# Patient Record
Sex: Female | Born: 1975 | Race: White | Hispanic: No | Smoking: Current every day smoker
Health system: Southern US, Community
[De-identification: ages and names within clinical notes are randomized; demographics above are authoritative.]

## PROBLEM LIST (undated history)

## (undated) DIAGNOSIS — F4310 Post-traumatic stress disorder, unspecified: Secondary | ICD-10-CM

## (undated) DIAGNOSIS — F329 Major depressive disorder, single episode, unspecified: Secondary | ICD-10-CM

## (undated) DIAGNOSIS — R569 Unspecified convulsions: Secondary | ICD-10-CM

## (undated) DIAGNOSIS — E039 Hypothyroidism, unspecified: Secondary | ICD-10-CM

## (undated) DIAGNOSIS — F419 Anxiety disorder, unspecified: Secondary | ICD-10-CM

## (undated) DIAGNOSIS — N390 Urinary tract infection, site not specified: Secondary | ICD-10-CM

## (undated) DIAGNOSIS — O24419 Gestational diabetes mellitus in pregnancy, unspecified control: Secondary | ICD-10-CM

## (undated) DIAGNOSIS — F32A Depression, unspecified: Secondary | ICD-10-CM

## (undated) DIAGNOSIS — N200 Calculus of kidney: Secondary | ICD-10-CM

## (undated) DIAGNOSIS — B1920 Unspecified viral hepatitis C without hepatic coma: Secondary | ICD-10-CM

## (undated) HISTORY — DX: Gestational diabetes mellitus in pregnancy, unspecified control: O24.419

## (undated) HISTORY — DX: Urinary tract infection, site not specified: N39.0

## (undated) HISTORY — PX: CHOLECYSTECTOMY: SHX55

## (undated) HISTORY — DX: Unspecified convulsions: R56.9

## (undated) HISTORY — PX: SHOULDER SURGERY: SHX246

## (undated) HISTORY — DX: Unspecified viral hepatitis C without hepatic coma: B19.20

---

## 2004-06-25 ENCOUNTER — Inpatient Hospital Stay: Payer: Self-pay | Admitting: Psychiatry

## 2004-07-23 ENCOUNTER — Ambulatory Visit: Payer: Self-pay | Admitting: Pain Medicine

## 2004-07-29 ENCOUNTER — Ambulatory Visit: Payer: Self-pay | Admitting: Pain Medicine

## 2004-08-25 ENCOUNTER — Ambulatory Visit: Payer: Self-pay | Admitting: Pain Medicine

## 2004-10-26 ENCOUNTER — Ambulatory Visit: Payer: Self-pay | Admitting: Pain Medicine

## 2005-03-01 ENCOUNTER — Ambulatory Visit: Payer: Self-pay | Admitting: Urology

## 2005-03-31 ENCOUNTER — Ambulatory Visit: Payer: Self-pay | Admitting: Urology

## 2006-04-19 ENCOUNTER — Encounter: Payer: Self-pay | Admitting: Pediatrics

## 2006-06-14 ENCOUNTER — Ambulatory Visit: Payer: Self-pay | Admitting: Urology

## 2006-11-04 ENCOUNTER — Emergency Department: Payer: Self-pay | Admitting: Emergency Medicine

## 2007-07-07 ENCOUNTER — Ambulatory Visit: Payer: Self-pay | Admitting: Pediatrics

## 2009-01-31 ENCOUNTER — Ambulatory Visit: Payer: Self-pay | Admitting: Pediatrics

## 2010-01-15 ENCOUNTER — Ambulatory Visit: Payer: Self-pay | Admitting: Pediatrics

## 2011-11-27 ENCOUNTER — Emergency Department: Payer: Self-pay | Admitting: Emergency Medicine

## 2012-04-27 ENCOUNTER — Emergency Department: Payer: Self-pay | Admitting: Emergency Medicine

## 2012-04-27 LAB — COMPREHENSIVE METABOLIC PANEL
Albumin: 4.2 g/dL (ref 3.4–5.0)
Alkaline Phosphatase: 77 U/L (ref 50–136)
Anion Gap: 3 — ABNORMAL LOW (ref 7–16)
BUN: 5 mg/dL — ABNORMAL LOW (ref 7–18)
Chloride: 111 mmol/L — ABNORMAL HIGH (ref 98–107)
EGFR (Non-African Amer.): >60
Glucose: 105 mg/dL — ABNORMAL HIGH (ref 65–99)
Osmolality: 277 (ref 275–301)
Potassium: 4.2 mmol/L (ref 3.5–5.1)
SGOT(AST): 32 U/L (ref 15–37)
Sodium: 140 mmol/L (ref 136–145)
Total Protein: 7.8 g/dL (ref 6.4–8.2)

## 2012-04-27 LAB — CBC WITH DIFFERENTIAL/PLATELET
Eosinophil #: 0 10*3/uL (ref 0.0–0.7)
Lymphocyte #: 1.5 10*3/uL (ref 1.0–3.6)
MCH: 30.8 pg (ref 26.0–34.0)
MCHC: 34.3 g/dL (ref 32.0–36.0)
MCV: 90 fL (ref 80–100)
Monocyte #: 0.5 x10 3/mm (ref 0.2–0.9)
Neutrophil %: 75.2 %
Platelet: 271 10*3/uL (ref 150–440)
RDW: 12.9 % (ref 11.5–14.5)

## 2012-04-27 LAB — URINALYSIS, COMPLETE
Bilirubin,UR: NEGATIVE
Glucose,UR: NEGATIVE mg/dL (ref 0–75)
Leukocyte Esterase: NEGATIVE
Nitrite: NEGATIVE
Ph: 5 (ref 4.5–8.0)
Protein: 100
RBC,UR: 1 /HPF (ref 0–5)
Specific Gravity: 1.021 (ref 1.003–1.030)
Squamous Epithelial: 1
WBC UR: 2 /HPF (ref 0–5)

## 2012-04-27 LAB — DRUG SCREEN, URINE
Amphetamines, Ur Screen: NEGATIVE (ref ?–1000)
Barbiturates, Ur Screen: NEGATIVE (ref ?–200)
Benzodiazepine, Ur Scrn: POSITIVE (ref ?–200)
Cannabinoid 50 Ng, Ur ~~LOC~~: NEGATIVE (ref ?–50)
MDMA (Ecstasy)Ur Screen: POSITIVE (ref ?–500)
Phencyclidine (PCP) Ur S: NEGATIVE (ref ?–25)

## 2012-05-16 ENCOUNTER — Emergency Department: Payer: Self-pay | Admitting: Emergency Medicine

## 2012-05-16 LAB — DRUG SCREEN, URINE
Amphetamines, Ur Screen: NEGATIVE (ref ?–1000)
Barbiturates, Ur Screen: NEGATIVE (ref ?–200)
Benzodiazepine, Ur Scrn: POSITIVE (ref ?–200)
Cannabinoid 50 Ng, Ur ~~LOC~~: NEGATIVE (ref ?–50)
MDMA (Ecstasy)Ur Screen: NEGATIVE (ref ?–500)
Tricyclic, Ur Screen: NEGATIVE (ref ?–1000)

## 2012-05-16 LAB — BASIC METABOLIC PANEL
Anion Gap: 6 — ABNORMAL LOW (ref 7–16)
BUN: 11 mg/dL (ref 7–18)
Chloride: 104 mmol/L (ref 98–107)
Creatinine: 0.51 mg/dL — ABNORMAL LOW (ref 0.60–1.30)
EGFR (Non-African Amer.): >60
Glucose: 102 mg/dL — ABNORMAL HIGH (ref 65–99)
Osmolality: 270 (ref 275–301)
Sodium: 135 mmol/L — ABNORMAL LOW (ref 136–145)

## 2012-05-16 LAB — CBC
HCT: 39.9 % (ref 35.0–47.0)
HGB: 14.1 g/dL (ref 12.0–16.0)
MCH: 32.2 pg (ref 26.0–34.0)
MCHC: 35.4 g/dL (ref 32.0–36.0)
MCV: 91 fL (ref 80–100)
Platelet: 288 10*3/uL (ref 150–440)
WBC: 8.2 10*3/uL (ref 3.6–11.0)

## 2012-05-16 LAB — ETHANOL: Ethanol: <3 mg/dL

## 2012-10-29 ENCOUNTER — Emergency Department: Payer: Self-pay | Admitting: Emergency Medicine

## 2012-11-25 ENCOUNTER — Emergency Department: Payer: Self-pay | Admitting: Emergency Medicine

## 2013-05-03 ENCOUNTER — Emergency Department: Payer: Self-pay | Admitting: Emergency Medicine

## 2014-01-16 ENCOUNTER — Emergency Department: Payer: Self-pay | Admitting: Emergency Medicine

## 2014-01-16 LAB — COMPREHENSIVE METABOLIC PANEL
ALBUMIN: 3.1 g/dL — AB (ref 3.4–5.0)
ALK PHOS: 84 U/L
ANION GAP: 11 (ref 7–16)
AST: 17 U/L (ref 15–37)
BILIRUBIN TOTAL: 0.1 mg/dL — AB (ref 0.2–1.0)
BUN: 11 mg/dL (ref 7–18)
CHLORIDE: 108 mmol/L — AB (ref 98–107)
Calcium, Total: 8.9 mg/dL (ref 8.5–10.1)
Co2: 22 mmol/L (ref 21–32)
Creatinine: 0.98 mg/dL (ref 0.60–1.30)
EGFR (African American): >60
EGFR (Non-African Amer.): >60
GLUCOSE: 117 mg/dL — AB (ref 65–99)
OSMOLALITY: 282 (ref 275–301)
Potassium: 4.2 mmol/L (ref 3.5–5.1)
SGPT (ALT): 16 U/L
Sodium: 141 mmol/L (ref 136–145)
Total Protein: 7.4 g/dL (ref 6.4–8.2)

## 2014-01-16 LAB — CBC WITH DIFFERENTIAL/PLATELET
BASOS PCT: 0.6 %
Basophil #: 0 10*3/uL (ref 0.0–0.1)
Eosinophil #: 0.1 10*3/uL (ref 0.0–0.7)
Eosinophil %: 0.9 %
HCT: 42.4 % (ref 35.0–47.0)
HGB: 13.7 g/dL (ref 12.0–16.0)
LYMPHS PCT: 27.1 %
Lymphocyte #: 2 10*3/uL (ref 1.0–3.6)
MCH: 27.8 pg (ref 26.0–34.0)
MCHC: 32.2 g/dL (ref 32.0–36.0)
MCV: 86 fL (ref 80–100)
MONO ABS: 0.4 x10 3/mm (ref 0.2–0.9)
Monocyte %: 6 %
Neutrophil #: 4.8 10*3/uL (ref 1.4–6.5)
Neutrophil %: 65.4 %
Platelet: 280 10*3/uL (ref 150–440)
RBC: 4.91 10*6/uL (ref 3.80–5.20)
RDW: 15 % — ABNORMAL HIGH (ref 11.5–14.5)
WBC: 7.4 10*3/uL (ref 3.6–11.0)

## 2014-01-16 LAB — URINALYSIS, COMPLETE
BILIRUBIN, UR: NEGATIVE
Blood: NEGATIVE
GLUCOSE, UR: NEGATIVE mg/dL (ref 0–75)
Ketone: NEGATIVE
NITRITE: NEGATIVE
Ph: 7 (ref 4.5–8.0)
Protein: NEGATIVE
RBC,UR: <1 /HPF (ref 0–5)
SPECIFIC GRAVITY: 1.006 (ref 1.003–1.030)
WBC UR: 52 /HPF (ref 0–5)

## 2014-01-16 LAB — LIPASE, BLOOD: LIPASE: 130 U/L (ref 73–393)

## 2014-01-18 LAB — URINE CULTURE

## 2014-01-21 ENCOUNTER — Emergency Department: Payer: Self-pay | Admitting: Emergency Medicine

## 2014-01-21 LAB — URINALYSIS, COMPLETE
Bilirubin,UR: NEGATIVE
Blood: NEGATIVE
GLUCOSE, UR: NEGATIVE mg/dL (ref 0–75)
Ketone: NEGATIVE
Leukocyte Esterase: NEGATIVE
Nitrite: NEGATIVE
Ph: 6 (ref 4.5–8.0)
Protein: NEGATIVE
RBC,UR: NONE SEEN /HPF (ref 0–5)
Specific Gravity: 1.001 (ref 1.003–1.030)

## 2014-01-21 LAB — COMPREHENSIVE METABOLIC PANEL
ALK PHOS: 102 U/L
ALT: 19 U/L
Albumin: 3.8 g/dL (ref 3.4–5.0)
Anion Gap: 11 (ref 7–16)
BUN: 8 mg/dL (ref 7–18)
Bilirubin,Total: 0.2 mg/dL (ref 0.2–1.0)
CHLORIDE: 109 mmol/L — AB (ref 98–107)
CREATININE: 1 mg/dL (ref 0.60–1.30)
Calcium, Total: 8.8 mg/dL (ref 8.5–10.1)
Co2: 21 mmol/L (ref 21–32)
EGFR (Non-African Amer.): >60
Glucose: 121 mg/dL — ABNORMAL HIGH (ref 65–99)
Osmolality: 281 (ref 275–301)
POTASSIUM: 3.8 mmol/L (ref 3.5–5.1)
SGOT(AST): 26 U/L (ref 15–37)
SODIUM: 141 mmol/L (ref 136–145)
Total Protein: 8.4 g/dL — ABNORMAL HIGH (ref 6.4–8.2)

## 2014-01-21 LAB — CBC WITH DIFFERENTIAL/PLATELET
Basophil #: 0 10*3/uL (ref 0.0–0.1)
Basophil %: 0.4 %
EOS PCT: 0.5 %
Eosinophil #: 0 10*3/uL (ref 0.0–0.7)
HCT: 43.8 % (ref 35.0–47.0)
HGB: 14.4 g/dL (ref 12.0–16.0)
LYMPHS PCT: 19.2 %
Lymphocyte #: 1.6 10*3/uL (ref 1.0–3.6)
MCH: 28.7 pg (ref 26.0–34.0)
MCHC: 32.8 g/dL (ref 32.0–36.0)
MCV: 88 fL (ref 80–100)
Monocyte #: 0.4 x10 3/mm (ref 0.2–0.9)
Monocyte %: 4.8 %
Neutrophil #: 6.3 10*3/uL (ref 1.4–6.5)
Neutrophil %: 75.1 %
Platelet: 342 10*3/uL (ref 150–440)
RBC: 5 10*6/uL (ref 3.80–5.20)
RDW: 15.1 % — AB (ref 11.5–14.5)
WBC: 8.3 10*3/uL (ref 3.6–11.0)

## 2014-01-21 LAB — PREGNANCY, URINE: PREGNANCY TEST, URINE: NEGATIVE m[IU]/mL

## 2014-02-08 ENCOUNTER — Emergency Department: Payer: Self-pay | Admitting: Emergency Medicine

## 2015-02-17 ENCOUNTER — Emergency Department
Admission: EM | Admit: 2015-02-17 | Discharge: 2015-02-17 | Disposition: A | Discharge status: Home / Self Care | Payer: Self-pay | Attending: Student | Admitting: Student

## 2015-02-17 ENCOUNTER — Encounter: Payer: Self-pay | Admitting: Emergency Medicine

## 2015-02-17 DIAGNOSIS — Z79899 Other long term (current) drug therapy: Secondary | ICD-10-CM | POA: Insufficient documentation

## 2015-02-17 DIAGNOSIS — Z3202 Encounter for pregnancy test, result negative: Secondary | ICD-10-CM | POA: Insufficient documentation

## 2015-02-17 DIAGNOSIS — N760 Acute vaginitis: Secondary | ICD-10-CM | POA: Insufficient documentation

## 2015-02-17 DIAGNOSIS — R1030 Lower abdominal pain, unspecified: Secondary | ICD-10-CM | POA: Insufficient documentation

## 2015-02-17 DIAGNOSIS — R110 Nausea: Secondary | ICD-10-CM | POA: Insufficient documentation

## 2015-02-17 DIAGNOSIS — Z720 Tobacco use: Secondary | ICD-10-CM | POA: Insufficient documentation

## 2015-02-17 DIAGNOSIS — B9689 Other specified bacterial agents as the cause of diseases classified elsewhere: Secondary | ICD-10-CM

## 2015-02-17 LAB — COMPREHENSIVE METABOLIC PANEL
ALBUMIN: 3.7 g/dL (ref 3.5–5.0)
ALT: 13 U/L — ABNORMAL LOW (ref 14–54)
ANION GAP: 8 (ref 5–15)
AST: 20 U/L (ref 15–41)
Alkaline Phosphatase: 79 U/L (ref 38–126)
BUN: 8 mg/dL (ref 6–20)
CHLORIDE: 108 mmol/L (ref 101–111)
CO2: 24 mmol/L (ref 22–32)
Calcium: 9 mg/dL (ref 8.9–10.3)
Creatinine, Ser: 0.92 mg/dL (ref 0.44–1.00)
GFR calc non Af Amer: >60 mL/min (ref >60)
GLUCOSE: 91 mg/dL (ref 65–99)
POTASSIUM: 3.7 mmol/L (ref 3.5–5.1)
SODIUM: 140 mmol/L (ref 135–145)
Total Bilirubin: 0.4 mg/dL (ref 0.3–1.2)
Total Protein: 6.8 g/dL (ref 6.5–8.1)

## 2015-02-17 LAB — URINALYSIS COMPLETE WITH MICROSCOPIC (ARMC ONLY)
BILIRUBIN URINE: NEGATIVE
Glucose, UA: NEGATIVE mg/dL
HGB URINE DIPSTICK: NEGATIVE
KETONES UR: NEGATIVE mg/dL
LEUKOCYTES UA: NEGATIVE
NITRITE: NEGATIVE
PH: 6 (ref 5.0–8.0)
PROTEIN: NEGATIVE mg/dL
Specific Gravity, Urine: 1.003 — ABNORMAL LOW (ref 1.005–1.030)

## 2015-02-17 LAB — CBC WITH DIFFERENTIAL/PLATELET
BASOS PCT: 0 %
Basophils Absolute: 0 10*3/uL (ref 0–0.1)
EOS ABS: 0.1 10*3/uL (ref 0–0.7)
EOS PCT: 1 %
HCT: 38.2 % (ref 35.0–47.0)
HEMOGLOBIN: 12.5 g/dL (ref 12.0–16.0)
LYMPHS ABS: 2.1 10*3/uL (ref 1.0–3.6)
Lymphocytes Relative: 32 %
MCH: 28.2 pg (ref 26.0–34.0)
MCHC: 32.7 g/dL (ref 32.0–36.0)
MCV: 86.2 fL (ref 80.0–100.0)
MONOS PCT: 4 %
Monocytes Absolute: 0.3 10*3/uL (ref 0.2–0.9)
NEUTROS PCT: 63 %
Neutro Abs: 4.1 10*3/uL (ref 1.4–6.5)
PLATELETS: 288 10*3/uL (ref 150–440)
RBC: 4.44 MIL/uL (ref 3.80–5.20)
RDW: 14.7 % — ABNORMAL HIGH (ref 11.5–14.5)
WBC: 6.6 10*3/uL (ref 3.6–11.0)

## 2015-02-17 LAB — CHLAMYDIA/NGC RT PCR (ARMC ONLY)
CHLAMYDIA TR: NOT DETECTED
N GONORRHOEAE: NOT DETECTED

## 2015-02-17 LAB — WET PREP, GENITAL
TRICH WET PREP: NONE SEEN
YEAST WET PREP: NONE SEEN

## 2015-02-17 LAB — POCT PREGNANCY, URINE: Preg Test, Ur: NEGATIVE

## 2015-02-17 LAB — LIPASE, BLOOD: Lipase: 17 U/L — ABNORMAL LOW (ref 22–51)

## 2015-02-17 MED ORDER — KETOROLAC TROMETHAMINE 30 MG/ML IJ SOLN
30.0000 mg | Freq: Once | INTRAMUSCULAR | Status: AC
  Administered 2015-02-17: 30 mg via INTRAMUSCULAR
  Filled 2015-02-17: qty 1

## 2015-02-17 MED ORDER — KETOROLAC TROMETHAMINE 30 MG/ML IJ SOLN: 30.0000 mg | Freq: Once | INTRAMUSCULAR | Status: DC

## 2015-02-17 MED ORDER — KETOROLAC TROMETHAMINE 60 MG/2ML IM SOLN: 30.0000 mg | Freq: Once | INTRAMUSCULAR | Status: DC

## 2015-02-17 MED ORDER — ONDANSETRON HCL 4 MG/2ML IJ SOLN: 4.0000 mg | Freq: Once | INTRAMUSCULAR | Status: DC

## 2015-02-17 MED ORDER — KETOROLAC TROMETHAMINE 60 MG/2ML IM SOLN: 60.0000 mg | Freq: Once | INTRAMUSCULAR | Status: DC

## 2015-02-17 MED ORDER — ONDANSETRON 4 MG PO TBDP
4.0000 mg | ORAL_TABLET | Freq: Once | ORAL | Status: AC
  Administered 2015-02-17: 4 mg via ORAL
  Filled 2015-02-17: qty 1

## 2015-02-17 MED ORDER — IBUPROFEN 600 MG PO TABS: 600.0000 mg | ORAL_TABLET | Freq: Four times a day (QID) | ORAL | Status: DC | PRN

## 2015-02-17 MED ORDER — ONDANSETRON 4 MG PO TBDP: 4.0000 mg | ORAL_TABLET | Freq: Once | ORAL | Status: DC

## 2015-02-17 MED ORDER — METRONIDAZOLE 500 MG PO TABS: 500.0000 mg | ORAL_TABLET | Freq: Two times a day (BID) | ORAL | Status: DC

## 2015-02-17 NOTE — ED Notes (Signed)
Patient reports having mid back pain that radiates straight across and vaginal discomfort since yesterday with nausea that has become progressively worse.  Patient reports similar episode in the past and diagnosed with kidney stones.  Patient reports took AZO at home without relief.

## 2015-02-17 NOTE — ED Provider Notes (Signed)
[facility] Emergency Department Provider Note  ____________________________________________  Time seen: Approximately 4:44 AM  I have reviewed the triage vital signs and the nursing notes.   HISTORY  Chief Complaint Back Pain    HPI Destiny Myers is a 39 y.o. female with history of thyroid disorder and history of kidney stones who presents for evaluation of diffuse lower abdominal pain radiating to the back, constant since yesterday, worsening. She describes it as waxing and waning but always present. It is a dull aching pain. She also reports a sensation of "scraping in my vagina" as well as nausea. She denies any abnormal vaginal bleeding or vaginal discharge. She denies any concern for sexual transmitted infection. She reports that in the past when she has had the symptoms she was diagnosed with kidney stones. She denies any dysuria or hematuria. No vomiting, diarrhea, fevers, chills, chest pain or difficulty breathing. No modifying factors.   History reviewed. No pertinent past medical history.  There are no active problems to display for this patient.   Past Surgical History  Procedure Laterality Date  . Cesarean section    . Shoulder surgery Right     Current Outpatient Rx  Name  Route  Sig  Dispense  Refill  . clonazePAM (KLONOPIN) 1 MG tablet   Oral   Take 1 mg by mouth 4 (four) times daily.         . cloNIDine (CATAPRES) 0.1 MG tablet   Oral   Take 0.1 mg by mouth 2 (two) times daily.         . cloNIDine (CATAPRES) 0.1 MG tablet   Oral   Take 0.1 mg by mouth at bedtime.         Marland Kitchen levothyroxine (SYNTHROID, LEVOTHROID) 88 MCG tablet   Oral   Take 88 mcg by mouth daily before breakfast.         . ibuprofen (ADVIL,MOTRIN) 600 MG tablet   Oral   Take 1 tablet (600 mg total) by mouth every 6 (six) hours as needed for moderate pain.   15 tablet   0   . metroNIDAZOLE (FLAGYL) 500 MG tablet   Oral   Take 1 tablet (500 mg  total) by mouth 2 (two) times daily. Do not drink alcohol while taking this medication.   14 tablet   0     Allergies Sulfa antibiotics and Tylenol  No family history on file.  Social History Social History  Substance Use Topics  . Smoking status: Current Every Day Smoker -- 0.50 packs/day    Types: Cigarettes  . Smokeless tobacco: None  . Alcohol Use: No    Review of Systems Constitutional: No fever/chills Eyes: No visual changes. ENT: No sore throat. Cardiovascular: Denies chest pain. Respiratory: Denies shortness of breath. Gastrointestinal: No abdominal pain.  + nausea, no vomiting.  No diarrhea.  No constipation. Genitourinary: Negative for dysuria. Musculoskeletal: positive for back pain. Skin: Negative for rash. Neurological: Negative for headaches, focal weakness or numbness.  10-point ROS otherwise negative.  ____________________________________________   PHYSICAL EXAM:  VITAL SIGNS: ED Triage Vitals  Enc Vitals Group     BP 02/17/15 0342 182/100 mmHg     Pulse Rate 02/17/15 0342 107     Resp 02/17/15 0342 18     Temp 02/17/15 0342 97.7 F (36.5 C)     Temp Source 02/17/15 0342 Oral     SpO2 02/17/15 0342 99 %     Weight 02/17/15 0342 195 lb (  88.451 kg)     Height 02/17/15 0342 5\' 4"  (1.626 m)     Head Cir --      Peak Flow --      Pain Score 02/17/15 0343 7     Pain Loc --      Pain Edu? --      Excl. in GC? --     Constitutional: Alert and oriented. Well appearing and in no acute distress. Eyes: Conjunctivae are normal. PERRL. EOMI. Head: Atraumatic. Nose: No congestion/rhinnorhea. Mouth/Throat: Mucous membranes are moist.  Oropharynx non-erythematous. Neck: No stridor.   Cardiovascular: normal rate, regular rhythm. Grossly normal heart sounds.  Good peripheral circulation. Respiratory: Normal respiratory effort.  No retractions. Lungs CTAB. Gastrointestinal: Soft and nontender. Normal bowel sounds. No distention.  No CVA  tenderness. Pelvic: No evidence of trauma to the vaginal walls, closed os with thin non-malodorous clear vaginal discharge. No bimanual tenderness or cervical motion tenderness. Musculoskeletal: No lower extremity tenderness nor edema.  No joint effusions. Neurologic:  Normal speech and language. No gross focal neurologic deficits are appreciated. No gait instability. Skin:  Skin is warm, dry and intact. No rash noted. Psychiatric: Mood and affect are normal. Speech and behavior are normal.  ____________________________________________   LABS (all labs ordered are listed, but only abnormal results are displayed)  Labs Reviewed  WET PREP, GENITAL - Abnormal; Notable for the following:    Clue Cells Wet Prep HPF POC MODERATE (*)    WBC, Wet Prep HPF POC FEW (*)    All other components within normal limits  URINALYSIS COMPLETEWITH MICROSCOPIC ([facility] ONLY) - Abnormal; Notable for the following:    Color, Urine YELLOW (*)    APPearance CLEAR (*)    Specific Gravity, Urine 1.003 (*)    Bacteria, UA RARE (*)    Squamous Epithelial / LPF 0-5 (*)    All other components within normal limits  CBC WITH DIFFERENTIAL/PLATELET - Abnormal; Notable for the following:    RDW 14.7 (*)    All other components within normal limits  COMPREHENSIVE METABOLIC PANEL - Abnormal; Notable for the following:    ALT 13 (*)    All other components within normal limits  LIPASE, BLOOD - Abnormal; Notable for the following:    Lipase 17 (*)    All other components within normal limits  CHLAMYDIA/NGC RT PCR ([facility] ONLY)  POCT PREGNANCY, URINE   ____________________________________________  EKG  None ____________________________________________  RADIOLOGY  none ____________________________________________   PROCEDURES  Procedure(s) performed: None  Critical Care performed: No  ____________________________________________   INITIAL IMPRESSION / ASSESSMENT AND PLAN / ED  COURSE  Pertinent labs & imaging results that were available during my care of the patient were reviewed by me and considered in my medical decision making (see chart for details).  [name] is a 39 y.o. female with history of thyroid disorder and history of kidney stones who presents for evaluation of diffuse lower abdominal pain radiating to the back, constant since yesterday, worsening. On exam, she is very well-appearing and in no acute distress. Mildly tachycardic and also hypertensive on arrival however this resolved without any intervention  by the time I performed my initial assessment. Her abdominal exam is benign and I doubt any acute life-threatening interabdominal process. She has no tenderness, rebound, guarding. No bimanual or cervical motion tenderness on exam, doubt PID (GC chlamydia pending). Labs reviewed. Normal CBC, normal CMP. Negative pregnancy test. Urinalysis not consistent with urinary tract infection, no red blood  cells and I doubt any acute obstructing nephrolithiasis. She does have moderate clue cells on her wet prep so will treat with Flagyl for bacterial vaginosis. I do not think she requires any abdominal imaging at this time. Her pain is improved at this time. She is tolerating by mouth intake. I discussed with her that we would discharge her with close PCP follow-up and we discussed meticulous return precautions including immediate return precautions.. She is comfortable with the discharge plan. ____________________________________________   FINAL CLINICAL IMPRESSION(S) / ED DIAGNOSES  Final diagnoses:  Lower abdominal pain  Bacterial vaginosis      Gayla Doss, MD 02/17/15 [phone]

## 2015-02-17 NOTE — ED Notes (Signed)
Patient ambulatory to triage with steady gait, without difficulty or distress noted; pt reports vaginal pain and lower back pain since yesterday

## 2015-02-17 NOTE — ED Notes (Signed)
Resting quietly with eyes closed.

## 2015-02-18 ENCOUNTER — Telehealth: Payer: Self-pay | Admitting: Emergency Medicine

## 2015-02-18 NOTE — ED Notes (Signed)
Pt called here asking if pregnancy test was done.  i explained that result was negative, but she truly is only a few days late for period and they have been getting closer together.  i told her she could repeat the test in a few days and that she should follow up with her gyn about this and the change in her menses.  She agrees.

## 2015-06-16 ENCOUNTER — Encounter: Payer: Self-pay | Admitting: *Deleted

## 2015-06-16 ENCOUNTER — Inpatient Hospital Stay
Admission: EM | Admit: 2015-06-16 | Discharge: 2015-06-19 | DRG: 885 | Disposition: A | Discharge status: Home / Self Care | Payer: No Typology Code available for payment source | Source: Intra-hospital | Attending: Psychiatry | Admitting: Psychiatry

## 2015-06-16 ENCOUNTER — Emergency Department
Admission: EM | Admit: 2015-06-16 | Discharge: 2015-06-16 | Disposition: A | Discharge status: Other Acute Inpatient Hospital | Payer: No Typology Code available for payment source | Attending: Emergency Medicine | Admitting: Emergency Medicine

## 2015-06-16 ENCOUNTER — Encounter: Payer: Self-pay | Admitting: Emergency Medicine

## 2015-06-16 DIAGNOSIS — F1110 Opioid abuse, uncomplicated: Secondary | ICD-10-CM | POA: Diagnosis present

## 2015-06-16 DIAGNOSIS — F17210 Nicotine dependence, cigarettes, uncomplicated: Secondary | ICD-10-CM | POA: Diagnosis present

## 2015-06-16 DIAGNOSIS — Z915 Personal history of self-harm: Secondary | ICD-10-CM | POA: Diagnosis not present

## 2015-06-16 DIAGNOSIS — N946 Dysmenorrhea, unspecified: Secondary | ICD-10-CM | POA: Diagnosis present

## 2015-06-16 DIAGNOSIS — Z882 Allergy status to sulfonamides status: Secondary | ICD-10-CM

## 2015-06-16 DIAGNOSIS — G4700 Insomnia, unspecified: Secondary | ICD-10-CM | POA: Diagnosis present

## 2015-06-16 DIAGNOSIS — F332 Major depressive disorder, recurrent severe without psychotic features: Secondary | ICD-10-CM

## 2015-06-16 DIAGNOSIS — Y998 Other external cause status: Secondary | ICD-10-CM | POA: Insufficient documentation

## 2015-06-16 DIAGNOSIS — F1010 Alcohol abuse, uncomplicated: Secondary | ICD-10-CM | POA: Diagnosis present

## 2015-06-16 DIAGNOSIS — E039 Hypothyroidism, unspecified: Secondary | ICD-10-CM | POA: Diagnosis present

## 2015-06-16 DIAGNOSIS — T402X2A Poisoning by other opioids, intentional self-harm, initial encounter: Secondary | ICD-10-CM | POA: Insufficient documentation

## 2015-06-16 DIAGNOSIS — F4310 Post-traumatic stress disorder, unspecified: Secondary | ICD-10-CM | POA: Diagnosis present

## 2015-06-16 DIAGNOSIS — F419 Anxiety disorder, unspecified: Secondary | ICD-10-CM | POA: Diagnosis present

## 2015-06-16 DIAGNOSIS — F17200 Nicotine dependence, unspecified, uncomplicated: Secondary | ICD-10-CM | POA: Diagnosis present

## 2015-06-16 DIAGNOSIS — Z62810 Personal history of physical and sexual abuse in childhood: Secondary | ICD-10-CM | POA: Diagnosis present

## 2015-06-16 DIAGNOSIS — Y9289 Other specified places as the place of occurrence of the external cause: Secondary | ICD-10-CM | POA: Insufficient documentation

## 2015-06-16 DIAGNOSIS — Z9889 Other specified postprocedural states: Secondary | ICD-10-CM | POA: Diagnosis not present

## 2015-06-16 DIAGNOSIS — T50902A Poisoning by unspecified drugs, medicaments and biological substances, intentional self-harm, initial encounter: Secondary | ICD-10-CM

## 2015-06-16 DIAGNOSIS — T426X2A Poisoning by other antiepileptic and sedative-hypnotic drugs, intentional self-harm, initial encounter: Secondary | ICD-10-CM | POA: Insufficient documentation

## 2015-06-16 DIAGNOSIS — F515 Nightmare disorder: Secondary | ICD-10-CM | POA: Diagnosis present

## 2015-06-16 DIAGNOSIS — E8881 Metabolic syndrome: Secondary | ICD-10-CM | POA: Diagnosis present

## 2015-06-16 DIAGNOSIS — Z818 Family history of other mental and behavioral disorders: Secondary | ICD-10-CM

## 2015-06-16 DIAGNOSIS — F1310 Sedative, hypnotic or anxiolytic abuse, uncomplicated: Secondary | ICD-10-CM | POA: Insufficient documentation

## 2015-06-16 DIAGNOSIS — R45851 Suicidal ideations: Secondary | ICD-10-CM | POA: Diagnosis present

## 2015-06-16 DIAGNOSIS — Z813 Family history of other psychoactive substance abuse and dependence: Secondary | ICD-10-CM

## 2015-06-16 DIAGNOSIS — Z79899 Other long term (current) drug therapy: Secondary | ICD-10-CM | POA: Insufficient documentation

## 2015-06-16 DIAGNOSIS — T1491XA Suicide attempt, initial encounter: Secondary | ICD-10-CM

## 2015-06-16 DIAGNOSIS — Z3202 Encounter for pregnancy test, result negative: Secondary | ICD-10-CM | POA: Insufficient documentation

## 2015-06-16 DIAGNOSIS — Y9389 Activity, other specified: Secondary | ICD-10-CM | POA: Insufficient documentation

## 2015-06-16 HISTORY — DX: Depression, unspecified: F32.A

## 2015-06-16 HISTORY — DX: Hypothyroidism, unspecified: E03.9

## 2015-06-16 HISTORY — DX: Anxiety disorder, unspecified: F41.9

## 2015-06-16 HISTORY — DX: Post-traumatic stress disorder, unspecified: F43.10

## 2015-06-16 HISTORY — DX: Major depressive disorder, single episode, unspecified: F32.9

## 2015-06-16 LAB — COMPREHENSIVE METABOLIC PANEL
ALT: 23 U/L (ref 14–54)
AST: 44 U/L — AB (ref 15–41)
Albumin: 4.3 g/dL (ref 3.5–5.0)
Alkaline Phosphatase: 93 U/L (ref 38–126)
Anion gap: 10 (ref 5–15)
BUN: 10 mg/dL (ref 6–20)
CALCIUM: 8.9 mg/dL (ref 8.9–10.3)
CO2: 23 mmol/L (ref 22–32)
Chloride: 105 mmol/L (ref 101–111)
Creatinine, Ser: 1.1 mg/dL — ABNORMAL HIGH (ref 0.44–1.00)
GFR calc Af Amer: >60 mL/min (ref >60)
Glucose, Bld: 239 mg/dL — ABNORMAL HIGH (ref 65–99)
Potassium: 3.8 mmol/L (ref 3.5–5.1)
Sodium: 138 mmol/L (ref 135–145)
Total Protein: 7.6 g/dL (ref 6.5–8.1)

## 2015-06-16 LAB — URINALYSIS COMPLETE WITH MICROSCOPIC (ARMC ONLY)
BACTERIA UA: NONE SEEN
Bilirubin Urine: NEGATIVE
GLUCOSE, UA: 150 mg/dL — AB
HGB URINE DIPSTICK: NEGATIVE
Ketones, ur: NEGATIVE mg/dL
LEUKOCYTES UA: NEGATIVE
Nitrite: NEGATIVE
PH: 5 (ref 5.0–8.0)
Protein, ur: NEGATIVE mg/dL
SPECIFIC GRAVITY, URINE: 1.012 (ref 1.005–1.030)

## 2015-06-16 LAB — LIPID PANEL
CHOL/HDL RATIO: 3.1 ratio
CHOLESTEROL: 144 mg/dL (ref 0–200)
HDL: 46 mg/dL (ref >40)
LDL Cholesterol: 88 mg/dL (ref 0–99)
TRIGLYCERIDES: 49 mg/dL (ref <150)
VLDL: 10 mg/dL (ref 0–40)

## 2015-06-16 LAB — CBC
HEMATOCRIT: 45.5 % (ref 35.0–47.0)
Hemoglobin: 14.5 g/dL (ref 12.0–16.0)
MCH: 28.4 pg (ref 26.0–34.0)
MCHC: 32 g/dL (ref 32.0–36.0)
MCV: 89 fL (ref 80.0–100.0)
Platelets: 281 10*3/uL (ref 150–440)
RBC: 5.12 MIL/uL (ref 3.80–5.20)
RDW: 15 % — AB (ref 11.5–14.5)
WBC: 20.4 10*3/uL — AB (ref 3.6–11.0)

## 2015-06-16 LAB — ACETAMINOPHEN LEVEL

## 2015-06-16 LAB — SALICYLATE LEVEL

## 2015-06-16 LAB — URINE DRUG SCREEN, QUALITATIVE (ARMC ONLY)
AMPHETAMINES, UR SCREEN: NOT DETECTED
BENZODIAZEPINE, UR SCRN: POSITIVE — AB
Barbiturates, Ur Screen: NOT DETECTED
COCAINE METABOLITE, UR ~~LOC~~: NOT DETECTED
Cannabinoid 50 Ng, Ur ~~LOC~~: NOT DETECTED
MDMA (ECSTASY) UR SCREEN: NOT DETECTED
METHADONE SCREEN, URINE: NOT DETECTED
OPIATE, UR SCREEN: POSITIVE — AB
PHENCYCLIDINE (PCP) UR S: NOT DETECTED
Tricyclic, Ur Screen: NOT DETECTED

## 2015-06-16 LAB — ETHANOL: Alcohol, Ethyl (B): 5 mg/dL — ABNORMAL HIGH (ref <5)

## 2015-06-16 LAB — TSH: TSH: 1.231 u[IU]/mL (ref 0.350–4.500)

## 2015-06-16 LAB — PREGNANCY, URINE: PREG TEST UR: NEGATIVE

## 2015-06-16 MED ORDER — LEVOTHYROXINE SODIUM 75 MCG PO TABS
75.0000 ug | ORAL_TABLET | Freq: Every day | ORAL | Status: DC
  Filled 2015-06-16: qty 1

## 2015-06-16 MED ORDER — SERTRALINE HCL 100 MG PO TABS
100.0000 mg | ORAL_TABLET | Freq: Every day | ORAL | Status: DC
  Administered 2015-06-17 – 2015-06-18 (×2): 100 mg via ORAL
  Filled 2015-06-16 (×2): qty 1

## 2015-06-16 MED ORDER — ACETAMINOPHEN 325 MG PO TABS: 650.0000 mg | ORAL_TABLET | Freq: Four times a day (QID) | ORAL | Status: DC | PRN

## 2015-06-16 MED ORDER — CLONAZEPAM 0.5 MG PO TABS
1.0000 mg | ORAL_TABLET | Freq: Three times a day (TID) | ORAL | Status: DC
  Administered 2015-06-16: 1 mg via ORAL
  Filled 2015-06-16: qty 2

## 2015-06-16 MED ORDER — CLONIDINE HCL 0.1 MG PO TABS
0.1000 mg | ORAL_TABLET | Freq: Two times a day (BID) | ORAL | Status: DC
  Administered 2015-06-16 – 2015-06-18 (×5): 0.1 mg via ORAL
  Filled 2015-06-16 (×5): qty 1

## 2015-06-16 MED ORDER — LEVOTHYROXINE SODIUM 88 MCG PO TABS
88.0000 ug | ORAL_TABLET | Freq: Every day | ORAL | Status: DC
  Administered 2015-06-17 – 2015-06-18 (×2): 88 ug via ORAL
  Filled 2015-06-16 (×4): qty 1

## 2015-06-16 MED ORDER — MAGNESIUM HYDROXIDE 400 MG/5ML PO SUSP: 30.0000 mL | Freq: Every day | ORAL | Status: DC | PRN

## 2015-06-16 MED ORDER — NALOXONE HCL 2 MG/2ML IJ SOSY
1.0000 mg | PREFILLED_SYRINGE | Freq: Once | INTRAMUSCULAR | Status: AC
  Administered 2015-06-16: 1 mg via INTRAVENOUS

## 2015-06-16 MED ORDER — SERTRALINE HCL 100 MG PO TABS
100.0000 mg | ORAL_TABLET | Freq: Every day | ORAL | Status: DC
  Administered 2015-06-16: 100 mg via ORAL
  Filled 2015-06-16: qty 1

## 2015-06-16 MED ORDER — ALUM & MAG HYDROXIDE-SIMETH 200-200-20 MG/5ML PO SUSP
30.0000 mL | ORAL | Status: DC | PRN
Start: 2015-06-16 — End: 2015-06-19

## 2015-06-16 MED ORDER — CLONIDINE HCL 0.1 MG PO TABS
0.1000 mg | ORAL_TABLET | Freq: Two times a day (BID) | ORAL | Status: DC
  Administered 2015-06-16: 0.1 mg via ORAL
  Filled 2015-06-16: qty 1

## 2015-06-16 MED ORDER — CLONAZEPAM 1 MG PO TABS
1.0000 mg | ORAL_TABLET | Freq: Three times a day (TID) | ORAL | Status: DC
  Administered 2015-06-16: 1 mg via ORAL
  Filled 2015-06-16: qty 1

## 2015-06-16 MED ORDER — LEVOTHYROXINE SODIUM 88 MCG PO TABS: 88.0000 ug | ORAL_TABLET | Freq: Every day | ORAL | Status: DC

## 2015-06-16 MED ORDER — NALOXONE HCL 2 MG/2ML IJ SOSY
PREFILLED_SYRINGE | INTRAMUSCULAR | Status: AC
  Administered 2015-06-16: 1 mg via INTRAVENOUS
  Filled 2015-06-16: qty 2

## 2015-06-16 MED ORDER — SODIUM CHLORIDE 0.9 % IV SOLN
1000.0000 mL | Freq: Once | INTRAVENOUS | Status: AC
  Administered 2015-06-16: 1000 mL via INTRAVENOUS

## 2015-06-16 NOTE — ED Notes (Signed)
MD at bedside. 

## 2015-06-16 NOTE — ED Notes (Signed)
Skin warm and dry and pink. Cont to maintain SATs with 02 on. Cont very drowsy. VS as noted.
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
## 2015-06-16 NOTE — ED Notes (Signed)
Pt very drowsy, does respond with one word with stimulation but returns to resting with eyes closed, Narcan given, patient opens eyes and asks for something to drink, able to swallow po, cont drowsy but awake.
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
## 2015-06-16 NOTE — ED Notes (Signed)
Returns to sleep, maintains SATs with nasal 02.
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
## 2015-06-16 NOTE — ED Notes (Signed)
When questioned re husband states he will upset when he comes in and that she does not want to see him.

## 2015-06-16 NOTE — ED Provider Notes (Signed)
[facility] Emergency Department Provider Note  ____________________________________________  Time seen: On arrival  I have reviewed the triage vital signs and the nursing notes.   HISTORY  Chief Complaint Altered Mental Status    HPI Destiny Myers is a 40 y.o. female who presents with reported altered mental status. Per EMS they were called out for unresponsive patient. They arrived at the house to find the patient lying in bed with what they described as agonal breathing. They did give 2 mg of IV Narcan and this appeared to improve the patient's breathing. Upon arrival to the ED patient is alert and oriented and answered questions appropriately.  Patient admits to drinking beers, approximately 24, as well as taking 9 to 10, 15 milligram morphine pills and approximately 5 Lyrica pills from her friend without him knowing. When asked why she did this she says "I don't know". Review of records demonstrates a history of depression for which she sees Dr. Mariana Kaufman at [facility]. She does complain of normal hearing, that she describes an echo. She also describes it both of her legs or tingling. She denies muscle weakness however, she denies headache, she denies trauma.       No past medical history on file.  There are no active problems to display for this patient.   Past Surgical History  Procedure Laterality Date  . Cesarean section    . Shoulder surgery Right     Current Outpatient Rx  Name  Route  Sig  Dispense  Refill  . clonazePAM (KLONOPIN) 1 MG tablet   Oral   Take 1 mg by mouth 4 (four) times daily.         . cloNIDine (CATAPRES) 0.1 MG tablet   Oral   Take 0.1 mg by mouth 2 (two) times daily.         . cloNIDine (CATAPRES) 0.1 MG tablet   Oral   Take 0.1 mg by mouth at bedtime.         Marland Kitchen ibuprofen (ADVIL,MOTRIN) 600 MG tablet   Oral   Take 1 tablet (600 mg total) by mouth every 6 (six) hours as needed for moderate pain.   15 tablet   0   . levothyroxine (SYNTHROID, LEVOTHROID) 88 MCG tablet   Oral   Take 88 mcg by mouth daily before breakfast.         . metroNIDAZOLE (FLAGYL) 500 MG tablet   Oral   Take 1 tablet (500 mg total) by mouth 2 (two) times daily. Do not drink alcohol while taking this medication.   14 tablet   0     Allergies Sulfa antibiotics and Tylenol  No family history on file.  Social History Social History  Substance Use Topics  . Smoking status: Current Every Day Smoker -- 0.50 packs/day    Types: Cigarettes  . Smokeless tobacco: Not on file  . Alcohol Use: No    Review of Systems  Constitutional: Negative for recent illness Eyes: Negative for visual changes. ENT: Negative for sore throat, abnormal hearing as above Cardiovascular: Negative for chest pain. Respiratory: Negative for shortness of breath. Gastrointestinal: Negative for abdominal pain Genitourinary: Negative for dysuria. Musculoskeletal: Negative for back pain. Skin: Negative for rash. Neurological: Negative for headaches, no weakness Psychiatric: Patient appears depressed    ____________________________________________   PHYSICAL EXAM:  VITAL SIGNS: ED Triage Vitals  Enc Vitals Group     BP 06/16/15 0655 140/90 mmHg     Pulse Rate 06/16/15 0655  107     Resp 06/16/15 0655 18     Temp 06/16/15 0655 97.3 F (36.3 C)     Temp Source 06/16/15 0655 Oral     SpO2 06/16/15 0655 97 %     Weight 06/16/15 0655 189 lb (85.73 kg)     Height 06/16/15 0655 5\' 4"  (1.626 m)     Head Cir --      Peak Flow --      Pain Score --      Pain Loc --      Pain Edu? --      Excl. in GC? --      Constitutional: Alert and oriented. No acute distress Eyes: Conjunctivae are normal. PERRLA, EOMI ENT   Head: Normocephalic and atraumatic.   Mouth/Throat: Mucous membranes are moist. Cardiovascular: Normal rate, regular rhythm. Normal and symmetric distal pulses are present in all extremities. No murmurs, rubs, or  gallops. Respiratory: Normal respiratory effort without tachypnea nor retractions. Breath sounds are clear and equal bilaterally.  Gastrointestinal: Soft and non-tender in all quadrants. No distention. There is no CVA tenderness. Genitourinary: deferred Musculoskeletal: Nontender with normal range of motion in all extremities. No lower extremity tenderness nor edema. Neurologic:  Normal speech and language. No gross focal neurologic deficits are appreciated. Cranial nerves II through XII are normal, normal strength in all extremities, incision grossly intact Skin:  Skin is warm, dry and intact. No rash noted. Psychiatric: Mood and affect are normal. Patient exhibits appropriate insight and judgment.  ____________________________________________    LABS (pertinent positives/negatives)  Labs Reviewed - No data to display  ____________________________________________   EKG  ED ECG REPORT I, Jene EveryKINNER, Annabelle Rexroad, the attending physician, personally viewed and interpreted this ECG.  Date: 06/16/2015 EKG Time: 6:44 AM Rate: 106 Rhythm: Sinus tachycardia QRS Axis: normal Intervals: normal ST/T Wave abnormalities: normal Conduction Disutrbances: none Narrative Interpretation: unremarkable   ____________________________________________    RADIOLOGY I have personally reviewed any xrays that were ordered on this patient: None  ____________________________________________   PROCEDURES  Procedure(s) performed: none  Critical Care performed: none  ____________________________________________   INITIAL IMPRESSION / ASSESSMENT AND PLAN / ED COURSE  Pertinent labs & imaging results that were available during my care of the patient were reviewed by me and considered in my medical decision making (see chart for details).  Patient presents after suspicious abuse of alcohol and prescription pills with a history of major depression. I am concerned that she did this in attempt to hurt  herself, hence I will place her under involuntary commitment have psychiatry see her. We will send labs, and keep the patient on a cardiac monitor ----------------------------------------- 9:23 AM on 06/16/2015 -----------------------------------------  Psychiatry to admit ____________________________________________   FINAL CLINICAL IMPRESSION(S) / ED DIAGNOSES  Final diagnoses:  Severe episode of recurrent major depressive disorder, without psychotic features (HCC)  Intentional drug overdose, initial encounter [facility](HCC)     Jene Everyobert Rishawn Walck, MD 06/16/15 [phone]

## 2015-06-16 NOTE — Progress Notes (Addendum)
Patient denies SI/HI/AVH, no needs voiced, no distress. Patient has depressed mood, flat affect. Support and encouragement offered. Pt requested medications early due to wanting to get to bed. Safety maintained.

## 2015-06-16 NOTE — Progress Notes (Signed)
Patient received to unit from ED escorted by security and staff member in scrubs.  Patient is tearful.  Verbalized that she did something bad.  States "I overdosed on morphine, lyrica and klonopin"  Further states that she was not trying to kill herself she was just trying to get high.  Body search and skin assessment performed.  Skin unremarkable, no contraband found.  Patient oriented to the unit and her room.

## 2015-06-16 NOTE — [facility] Counselor (Signed)
[facility] by Dr. Toni Amendlapacs.  Attending Physician will be Dr. Jennet MaduroPucilowska.   Patient has been assigned to room 304, by [facility] Charge Nurse DerbyPhyllis.   Intake Paper Work has been signed and placed on patient chart.  ER staff is aware of the admission Misty Stanley( Lisa, ER Sect.; Dr. Cyril LoosenKinner, ER MD; Cala BradfordKimberly Patient's Nurse & Byrd HesselbachMaria Patient Access).

## 2015-06-16 NOTE — ED Notes (Signed)
Taking sandwich and juice without difficulty. Await bed availability in Behavioral unit. Pt aware she will be admitted to that unit.

## 2015-06-16 NOTE — BHH Counselor (Signed)
Per Psych MD Dr.Clapacs, Pt is to be admitted to inpatient unit. Inpatient RN (Kennesha) has been provided with MRN for Charge RN bed assignment.  

## 2015-06-16 NOTE — ED Notes (Signed)
Patient wakes up. Verbal and coherent. Up to commode with steady gait.

## 2015-06-16 NOTE — ED Notes (Signed)
Prepared to go to room 304,

## 2015-06-16 NOTE — ED Notes (Signed)
Cont very drowsy, opens eyes when spoken to then returns to sleep. Maintains SATs with 02 on. MAE on command. Skin warm and dry and pink.

## 2015-06-17 ENCOUNTER — Encounter: Payer: Self-pay | Admitting: Psychiatry

## 2015-06-17 DIAGNOSIS — F332 Major depressive disorder, recurrent severe without psychotic features: Principal | ICD-10-CM

## 2015-06-17 DIAGNOSIS — F17200 Nicotine dependence, unspecified, uncomplicated: Secondary | ICD-10-CM | POA: Diagnosis present

## 2015-06-17 DIAGNOSIS — F4310 Post-traumatic stress disorder, unspecified: Secondary | ICD-10-CM | POA: Diagnosis present

## 2015-06-17 LAB — HEMOGLOBIN A1C: Hgb A1c MFr Bld: 5.4 % (ref 4.0–6.0)

## 2015-06-17 MED ORDER — NICOTINE 21 MG/24HR TD PT24
21.0000 mg | MEDICATED_PATCH | Freq: Every day | TRANSDERMAL | Status: DC
  Administered 2015-06-17 – 2015-06-18 (×2): 21 mg via TRANSDERMAL
  Filled 2015-06-17 (×2): qty 1

## 2015-06-17 MED ORDER — CLONAZEPAM 1 MG PO TABS
1.0000 mg | ORAL_TABLET | Freq: Three times a day (TID) | ORAL | Status: DC
  Administered 2015-06-17 – 2015-06-19 (×7): 1 mg via ORAL
  Filled 2015-06-17 (×8): qty 1

## 2015-06-17 MED ORDER — QUETIAPINE FUMARATE 25 MG PO TABS
50.0000 mg | ORAL_TABLET | Freq: Every day | ORAL | Status: DC
  Administered 2015-06-17: 50 mg via ORAL
  Filled 2015-06-17: qty 2

## 2015-06-17 MED ORDER — BUPROPION HCL ER (XL) 150 MG PO TB24
150.0000 mg | ORAL_TABLET | Freq: Every day | ORAL | Status: DC
  Administered 2015-06-17 – 2015-06-18 (×2): 150 mg via ORAL
  Filled 2015-06-17 (×2): qty 1

## 2015-06-17 MED ORDER — IBUPROFEN 800 MG PO TABS
400.0000 mg | ORAL_TABLET | Freq: Four times a day (QID) | ORAL | Status: DC | PRN
Start: 2015-06-17 — End: 2015-06-18
  Administered 2015-06-17 – 2015-06-18 (×2): 400 mg via ORAL
  Filled 2015-06-17 (×2): qty 1

## 2015-06-17 MED ORDER — CHLORDIAZEPOXIDE HCL 25 MG PO CAPS
25.0000 mg | ORAL_CAPSULE | Freq: Four times a day (QID) | ORAL | Status: DC
  Administered 2015-06-17: 25 mg via ORAL
  Filled 2015-06-17: qty 1

## 2015-06-17 MED ORDER — PRAZOSIN HCL 1 MG PO CAPS
2.0000 mg | ORAL_CAPSULE | Freq: Two times a day (BID) | ORAL | Status: DC
  Administered 2015-06-17 – 2015-06-18 (×4): 2 mg via ORAL
  Filled 2015-06-17 (×4): qty 2
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
## 2015-06-17 NOTE — Progress Notes (Signed)
Recreation Therapy Notes  Date: 01.10.17 Time: 3:00 pm Location: Craft Room  Group Topic: Goal Setting  Goal Area(s) Addresses:  Patient will write down one goal. Patient will write at least one encouraging statement.  Behavioral Response: Attentive, Interactive  Intervention: Step By Step  Activity: Patients were given a foot and instructed to write a goal towards their recovery on the inside of the foot and write positive statements on the outside.  Education: LRT educated patients on ways they can stay focused on their goals.  Education Outcome: Acknowledges education/In group clarification offered  Clinical Observations/Feedback: Patient completed activity by writing her goal and positive statements. Patient did not contribute to group discussion.  Jacquelynn CreeGreene,Bowdy Bair M, LRT/CTRS 06/17/2015 4:16 PM

## 2015-06-17 NOTE — Plan of Care (Signed)
Problem: Ineffective individual coping Goal: LTG: Patient will report a decrease in negative feelings Outcome: Progressing Working on coping skills     

## 2015-06-17 NOTE — Progress Notes (Signed)
Recreation Therapy Notes  INPATIENT RECREATION THERAPY ASSESSMENT  Patient Details Name: Destiny Myers MRN: [id number] DOB: June 08, 1975 Today's Date: 06/17/2015  Patient Stressors: Relationship, Death (Hasn't been getting along with husband; grandmother passed away last year)  Coping Skills:   Isolate, Substance Abuse, Avoidance, Art/Dance, Talking, Music  Personal Challenges: Communication, Concentration, Decision-Making, Expressing Yourself, Problem-Solving, Relationships, Self-Esteem/Confidence, Social Interaction, Stress Management, Substance Abuse, Trusting Others  Leisure Interests (2+):  Music - Listen, Individual - Other (Comment) (Watching movies)  Awareness of Community Resources:  Yes  Community Resources:  [place], [place]  Current Use: Yes  If no, Barriers?:    Patient Strengths:  Being a mother, creative  Patient Identified Areas of Improvement:  "All the ones you named that I said 'no' to"  Current Recreation Participation:  Hanging out with friends  Patient Goal for Hospitalization:  To start dealing with the stuff she has buried in the past and find gried counseling to deal with grandmothers' death  [place]idence[place]  County of Residence:  [place]   Current SI (including self-harm):  No  Current HI:  No  Consent to Intern Participation: N/A   Jacquelynn CreeGreene,Destiny Ainley M, LRT/CTRS 06/17/2015, 2:46 PM

## 2015-06-17 NOTE — [facility] Suicide Risk Assessment (Signed)
[facility] Admission Suicide Risk Assessment   Nursing information obtained from:    Demographic factors:    Current Mental Status:    Loss Factors:    Historical Factors:    Risk Reduction Factors:    Total Time spent with patient: 1 hour Principal Problem: Severe recurrent major depression without psychotic features [facility]) Diagnosis:   Patient Active Problem List   Diagnosis Date Noted  . Tobacco use disorder [F17.200] 06/17/2015  . Severe recurrent major depression without psychotic features (HCC) [F33.2] 06/16/2015  . Opioid use disorder, mild, abuse [F11.10] 06/16/2015  . Alcohol use disorder, mild, abuse [F10.10] 06/16/2015  . Suicide attempt (HCC) [T14.91] 06/16/2015  . Hypothyroidism [E03.9] 06/16/2015     Continued Clinical Symptoms:  Alcohol Use Disorder Identification Test Final Score (AUDIT): 2 The "Alcohol Use Disorders Identification Test", Guidelines for Use in Primary Care, Second Edition.  World Science writer [facility]). Score between 0-7:  no or low risk or alcohol related problems. Score between 8-15:  moderate risk of alcohol related problems. Score between 16-19:  high risk of alcohol related problems. Score 20 or above:  warrants further diagnostic evaluation for alcohol dependence and treatment.   CLINICAL FACTORS:   Depression:   Comorbid alcohol abuse/dependence Impulsivity Alcohol/Substance Abuse/Dependencies   Musculoskeletal: Strength & Muscle Tone: within normal limits Gait & Station: normal Patient leans: N/A  Psychiatric Specialty Exam: I reviewed physical exam performed in the emergency room with the findings. Physical Exam  Nursing note and vitals reviewed.   Review of Systems  Gastrointestinal: Positive for abdominal pain.  Musculoskeletal: Positive for joint pain.  All other systems reviewed and are negative.   Blood pressure 105/70, pulse 107, temperature 98.9 F (37.2 C), temperature source Oral, resp. rate 18, height 5\' 4"  (1.626 m),  weight 88.451 kg (195 lb), last menstrual period 05/18/2015, SpO2 96 %.Body mass index is 33.46 kg/(m^2).  General Appearance: Fairly Groomed  Patent attorney::  Good  Speech:  Clear and Coherent  Volume:  Normal  Mood:  Depressed, Hopeless and Worthless  Affect:  Blunt  Thought Process:  Goal Directed  Orientation:  Full (Time, Place, and Person)  Thought Content:  WDL  Suicidal Thoughts:  Yes.  with intent/plan  Homicidal Thoughts:  No  Memory:  Immediate;   Fair Recent;   Fair Remote;   Fair  Judgement:  Impaired  Insight:  Shallow  Psychomotor Activity:  Normal  Concentration:  Fair  Recall:  [employer] of Knowledge:Fair  Language: Fair  Akathisia:  No  Handed:  Right  AIMS (if indicated):     Assets:  Communication Skills Desire for Improvement Financial Resources/Insurance Housing Resilience Social Support  Sleep:  Number of Hours: 8  Cognition: WNL  ADL's:  Intact     COGNITIVE FEATURES THAT CONTRIBUTE TO RISK:  None    SUICIDE RISK:   Moderate:  Frequent suicidal ideation with limited intensity, and duration, some specificity in terms of plans, no associated intent, good self-control, limited dysphoria/symptomatology, some risk factors present, and identifiable protective factors, including available and accessible social support.  PLAN OF CARE: Hospital admission, medication management, substance abuse counseling, discharge planning.  Medical Decision Making:  New problem, with additional work up planned, Review of Psycho-Social Stressors (1), Review or order clinical lab tests (1), Review of Medication Regimen & Side Effects (2) and Review of New Medication or Change in Dosage (2)   Ms. Gullikson is a 40 year old female with a history of depression, anxiety, and substance abuse  admitted after suicide attempt by overdose on morphine, Lyrica, and Klonopin.  1. Suicidal ideation. The patient is uncertain if her overdose was intentional or not. She is passing suicidal  thoughts. She is able to contract for safety in the hospital.  2. Mood. She has been maintained on Zoloft in the community. We will and Wellbutrin for depression.  3. Insomnia. She's been tried on multiple sleeping aids. We'll start Seroquel 50 mg nightly.  4. Anxiety. She's been maintained on clonazepam by her outpatient provider. She wants to continue clonazepam. She had clonazepam withdrawal seizures several years ago. We will continue. We will start Minipress for nightmares and flashbacks from PTSD.  5. Hypothyroidism. We will continue Synthroid.  6. Substance abuse. The patient has been clean of all. Since 2013. She infrequently uses alcohol but it played a role in her overdose. She would like to follow up with outpatient substance abuse program.  7. Metabolic syndrome. Lipid profile, hemoglobin A1c and TSH are normal.  8. Disposition. She will be discharged to home with her family. She will follow up with a local provider.  I certify that inpatient services furnished can reasonably be expected to improve the patient's condition.   Morena Mckissack 06/17/2015, 10:43 AM
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
## 2015-06-17 NOTE — Tx Team (Signed)
Initial Interdisciplinary Treatment Plan   PATIENT STRESSORS: Marital or family conflict Substance abuse   PATIENT STRENGTHS: Ability for insight General fund of knowledge Supportive family/friends   PROBLEM LIST: Problem List/Patient Goals Date to be addressed Date deferred Reason deferred Estimated date of resolution   Depression  06/17/15     Substance abuse  06/17/15      Suicide 06/17/15                                          DISCHARGE CRITERIA:  Ability to meet basic life and health needs Adequate post-discharge living arrangements Safe-care adequate arrangements made Withdrawal symptoms are absent or subacute and managed without 24-hour nursing intervention  PRELIMINARY DISCHARGE PLAN: Outpatient therapy Return to previous living arrangement  PATIENT/FAMIILY INVOLVEMENT: This treatment plan has been presented to and reviewed with the patient, Shelbie AmmonsJennifer K Chumley, and/or family member, .  The patient and family have been given the opportunity to ask questions and make suggestions.  Zaley Talley A Dinari Stgermaine 06/17/2015, 11:04 AM

## 2015-06-17 NOTE — Progress Notes (Signed)
Pt took medications early as requested. Pt also asked about nicotine patch, RN stated that normally the patches are given in the AM in order to avoid any patients having nightmares as a side effect. Pt agreed she would ask the provider tomorrow about the nicotine patch.

## 2015-06-17 NOTE — Progress Notes (Signed)
D:  Voice of no issuesPatient stated slept good last night .Stated appetite is good and energy level  Is normal. Stated concentration is good  . Stated on Depression scale ,10 hopeless 8 and anxiety 10 .( low 0-10 high) Denies suicidal  homicidal ideations  .  No auditory hallucinations  No pain concerns . Appropriate ADL'S. Interacting with peers and staff. Voice of gallbladder pain . Requestno pain meds A: Encourage patient participation with unit programming . Instruction  Given on  Medication , verbalize understanding. R: Voice no other concerns. Staff continue to monitor

## 2015-06-17 NOTE — [facility] Group Notes (Signed)
[facility] Group Notes:  (Nursing/MHT/Case Management/Adjunct)  Date:  06/17/2015  Time:  4:55 PM  Type of Therapy:  Psychoeducational Skills  Participation Level:  Did Not Attend    Gurjot Brisco Travis Gram Siedlecki 06/17/2015, 4:55 PM 

## 2015-06-18 MED ORDER — BUPROPION HCL ER (XL) 150 MG PO TB24: 150.0000 mg | ORAL_TABLET | Freq: Every day | ORAL | Status: DC

## 2015-06-18 MED ORDER — LEVOTHYROXINE SODIUM 88 MCG PO TABS: 88.0000 ug | ORAL_TABLET | Freq: Every day | ORAL | Status: DC

## 2015-06-18 MED ORDER — QUETIAPINE FUMARATE 100 MG PO TABS
100.0000 mg | ORAL_TABLET | Freq: Every day | ORAL | Status: DC
  Administered 2015-06-18: 100 mg via ORAL
  Filled 2015-06-18: qty 1

## 2015-06-18 MED ORDER — CLONIDINE HCL 0.1 MG PO TABS: 0.1000 mg | ORAL_TABLET | Freq: Two times a day (BID) | ORAL | Status: DC

## 2015-06-18 MED ORDER — PRAZOSIN HCL 2 MG PO CAPS: 2.0000 mg | ORAL_CAPSULE | Freq: Two times a day (BID) | ORAL | Status: DC

## 2015-06-18 MED ORDER — QUETIAPINE FUMARATE 100 MG PO TABS: 100.0000 mg | ORAL_TABLET | Freq: Every day | ORAL | Status: DC

## 2015-06-18 MED ORDER — IBUPROFEN 800 MG PO TABS
800.0000 mg | ORAL_TABLET | Freq: Four times a day (QID) | ORAL | Status: DC | PRN
  Administered 2015-06-18: 800 mg via ORAL
  Filled 2015-06-18: qty 1

## 2015-06-18 NOTE — Plan of Care (Signed)
Problem: [facility] Participation in Recreation Therapeutic Interventions Goal: STG-Patient will demonstrate improved self esteem by identif STG: Self-Esteem - Within 4 treatment sessions, patient will verbalize at least 5 positive affirmation statements in each of 2 treatment sessions to increase self-esteem post d/c.  Outcome: Progressing Treatment Session 1; Completed 1 out of 2: At approximately 12:35 pm, LRT met with patient in patient room. Patient verbalized 5 positive affirmation statements. Patient reported it felt "good". LRT encouraged patient to continue saying positive affirmation statements. Intervention Used: I Am statements  Leonette Monarch, LRT/CTRS 01.11.17 5:08 pm Goal: STG-Patient will identify at least five coping skills for ** STG: Coping Skills - Within 4 treatment sessions, patient will verbalize at least 5 coping skills for substance abuse in each of 2 treatment sessions to decrease substance abuse post d/c.  Outcome: Progressing Treatment Session 1; Completed 1 out of 2: At approximately 12:35 pm, LRT met with patient in patient room. Patient verbalized 5 coping skills for substance abuse. LRT educated patient on leisure and why it is important to implement it into her schedule. LRT educated and provided patient with blank schedules to help her plan her day and try to avoid using substances. LRT educated patient on healthy support systems. Intervention Used: Coping Skills worksheet  Leonette Monarch, LRT/CTRS 01.11.17 5:10 pm Goal: STG-Other Recreation Therapy Goal (Specify) STG: Stress Management - Within 4 treatment sessions, patient will verbalize understanding of the stress management techniques in each of 2 treatment sessions to increase stress management skills post d/c.  Outcome: Progressing Treatment Session 1; Completed 1 out of 2: At approximately 12:35 pm, LRT met with patient in patient room. LRT educated and provided patient with handouts on stress management  techniques. Patient verbalized understanding. LRT encouraged patient to read over and practice the stress management techniques. Intervention Used: Stress Management handouts  Leonette Monarch, LRT/CTRS 01.11.17 5:11 pm

## 2015-06-18 NOTE — [facility] Group Notes (Signed)
[facility] LCSW Aftercare Discharge Planning Group Note   06/18/2015 4:40 PM  Participation Quality:  Did not attend   Reagen Haberman L Lettie Czarnecki MSW, LCSWA  

## 2015-06-18 NOTE — Plan of Care (Signed)
Problem: Ineffective individual coping Goal: LTG: Patient will report a decrease in negative feelings Outcome: Progressing Attending unit programing     

## 2015-06-18 NOTE — Progress Notes (Signed)
D:Voice of goal today work on going home . Patient stated slept good last night .Stated appetite is good and energy level  Is normal. Stated concentration is good . Stated on Depression scale 6 , hopeless  8 and anxiety .( low 0-10 high) Denies suicidal  homicidal ideations  .  No auditory hallucinations  No pain concerns . Appropriate ADL'S. Interacting with peers and staff.Clontinue to have pain with menstrual cramping   A: Encourage patient participation with unit programming . Instruction  Given on  Medication , verbalize understanding. R: Voice no other concerns. Staff continue to monitor

## 2015-06-18 NOTE — Plan of Care (Signed)
Problem: Alteration in mood Goal: LTG-Patient reports reduction in suicidal thoughts (Patient reports reduction in suicidal thoughts and is able to verbalize a safety plan for whenever patient is feeling suicidal)  Outcome: Progressing Patient denies SI at this time.      

## 2015-06-18 NOTE — Progress Notes (Signed)
[facility] MD Progress Note  06/18/2015 3:15 PM OYUKI HOGAN  MRN:  [id number]  Subjective:  Ms. Adinolfi feels much better today. Her mood is improving. Affect is brighter. She is no longer suicidal. She seems to tolerate medication changes well. She complains of menstrual pain and requests ibuprofen. She did not sleep last night. She spoke with her husband and her children and is excited to be going home tomorrow. Excellent group participation.  Principal Problem: Severe recurrent major depression without psychotic features (HCC) Diagnosis:   Patient Active Problem List   Diagnosis Date Noted  . Tobacco use disorder [F17.200] 06/17/2015  . PTSD (post-traumatic stress disorder) [F43.10] 06/17/2015  . Severe recurrent major depression without psychotic features (HCC) [F33.2] 06/16/2015  . Opioid use disorder, mild, abuse [F11.10] 06/16/2015  . Alcohol use disorder, mild, abuse [F10.10] 06/16/2015  . Suicide attempt (HCC) [T14.91] 06/16/2015  . Hypothyroidism [E03.9] 06/16/2015   Total Time spent with patient: 20 minutes  Past Psychiatric History: Depression  Past Medical History:  Past Medical History  Diagnosis Date  . Depression   . Anxiety   . PTSD (post-traumatic stress disorder)   . Hypothyroidism     Past Surgical History  Procedure Laterality Date  . Cesarean section    . Shoulder surgery Right    Family History: History reviewed. No pertinent family history. Family Psychiatric  History: Depression. Social History:  History  Alcohol Use  . Yes     History  Drug Use  . Yes  . Special: Marijuana    Comment: last smoked a few weeks ago    Social History   Social History  . Marital Status: Married    Spouse Name: N/A  . Number of Children: N/A  . Years of Education: N/A   Social History Main Topics  . Smoking status: Current Every Day Smoker -- 0.50 packs/day    Types: Cigarettes  . Smokeless tobacco: None  . Alcohol Use: Yes  . Drug Use: Yes    Special:  Marijuana     Comment: last smoked a few weeks ago  . Sexual Activity: Not Asked   Other Topics Concern  . None   Social History Narrative   Additional Social History:                         Sleep: Poor  Appetite:  Fair  Current Medications: Current Facility-Administered Medications  Medication Dose Route Frequency Provider Last Rate Last Dose  . acetaminophen (TYLENOL) tablet 650 mg  650 mg Oral Q6H PRN Audery Amel, MD      . alum & mag hydroxide-simeth (MAALOX/MYLANTA) 200-200-20 MG/5ML suspension 30 mL  30 mL Oral Q4H PRN Audery Amel, MD      . buPROPion (WELLBUTRIN XL) 24 hr tablet 150 mg  150 mg Oral Daily Gwenna Fuston B Neyland Pettengill, MD   150 mg at 06/18/15 0828  . clonazePAM (KLONOPIN) tablet 1 mg  1 mg Oral TID Shari Prows, MD   1 mg at 06/18/15 1422  . cloNIDine (CATAPRES) tablet 0.1 mg  0.1 mg Oral BID Audery Amel, MD   0.1 mg at 06/18/15 0828  . ibuprofen (ADVIL,MOTRIN) tablet 400 mg  400 mg Oral Q6H PRN Kerin Salen, MD   400 mg at 06/18/15 0834  . levothyroxine (SYNTHROID, LEVOTHROID) tablet 88 mcg  88 mcg Oral QAC breakfast Audery Amel, MD   88 mcg at 06/18/15 0654  . magnesium hydroxide (  MILK OF MAGNESIA) suspension 30 mL  30 mL Oral Daily PRN Audery AmelJohn T Clapacs, MD      . nicotine (NICODERM CQ - dosed in mg/24 hours) patch 21 mg  21 mg Transdermal Daily Khayman Kirsch B Arias Weinert, MD   21 mg at 06/18/15 0934  . prazosin (MINIPRESS) capsule 2 mg  2 mg Oral BID Shari ProwsJolanta B Cashlynn Yearwood, MD   2 mg at 06/18/15 0915  . QUEtiapine (SEROQUEL) tablet 100 mg  100 mg Oral QHS Macy Polio B Brenton Joines, MD      . sertraline (ZOLOFT) tablet 100 mg  100 mg Oral Daily Audery AmelJohn T Clapacs, MD   100 mg at 06/18/15 0915    Lab Results:  Results for orders placed or performed during the hospital encounter of 06/16/15 (from the past 48 hour(s))  Hemoglobin A1c     Status: None   Collection Time: 06/16/15  5:26 PM  Result Value Ref Range   Hgb A1c MFr Bld 5.4 4.0 - 6.0 %   Lipid panel, fasting     Status: None   Collection Time: 06/16/15  5:26 PM  Result Value Ref Range   Cholesterol 144 0 - 200 mg/dL   Triglycerides 49 <098<150 mg/dL   HDL 46 >11>40 mg/dL   Total CHOL/HDL Ratio 3.1 RATIO   VLDL 10 0 - 40 mg/dL   LDL Cholesterol 88 0 - 99 mg/dL    Comment:        Total Cholesterol/HDL:CHD Risk Coronary Heart Disease Risk Table                     Men   Women  1/2 Average Risk   3.4   3.3  Average Risk       5.0   4.4  2 X Average Risk   9.6   7.1  3 X Average Risk  23.4   11.0        Use the calculated Patient Ratio above and the CHD Risk Table to determine the patient's CHD Risk.        ATP III CLASSIFICATION (LDL):  <100     mg/dL   Optimal  [id number]  mg/dL   Near or Above                    Optimal  130-159  mg/dL   Borderline  [id number]  mg/dL   High  >086>190     mg/dL   Very High   TSH     Status: None   Collection Time: 06/16/15  5:26 PM  Result Value Ref Range   TSH 1.231 0.350 - 4.500 uIU/mL    Physical Findings: AIMS: Facial and Oral Movements Muscles of Facial Expression: None, normal Lips and Perioral Area: None, normal Jaw: None, normal Tongue: None, normal,Extremity Movements Upper (arms, wrists, hands, fingers): None, normal Lower (legs, knees, ankles, toes): None, normal, Trunk Movements Neck, shoulders, hips: None, normal, Overall Severity Severity of abnormal movements (highest score from questions above): None, normal Incapacitation due to abnormal movements: None, normal Patient's awareness of abnormal movements (rate only patient's report): No Awareness, Dental Status Current problems with teeth and/or dentures?: No Does patient usually wear dentures?: No  CIWA:    COWS:     Musculoskeletal: Strength & Muscle Tone: within normal limits Gait & Station: normal Patient leans: N/A  Psychiatric Specialty Exam: Review of Systems  All other systems reviewed and are negative.   Blood pressure 131/85, pulse 89, temperature  98.9 F (37.2 C), temperature source Oral, resp. rate 20, height 5\' 4"  (1.626 m), weight 88.451 kg (195 lb), last menstrual period 05/18/2015, SpO2 96 %.Body mass index is 33.46 kg/(m^2).  General Appearance: Casual  Eye Contact::  Good  Speech:  Clear and Coherent  Volume:  Normal  Mood:  Anxious  Affect:  Appropriate  Thought Process:  Goal Directed  Orientation:  Full (Time, Place, and Person)  Thought Content:  WDL  Suicidal Thoughts:  No  Homicidal Thoughts:  No  Memory:  Immediate;   Fair Recent;   Fair Remote;   Fair  Judgement:  Impaired  Insight:  Present  Psychomotor Activity:  Normal  Concentration:  Fair  Recall:  [employer] of Knowledge:Fair  Language: Fair  Akathisia:  No  Handed:  Right  AIMS (if indicated):     Assets:  Communication Skills Desire for Improvement Housing Physical Health Resilience Social Support  ADL's:  Intact  Cognition: WNL  Sleep:  Number of Hours: 7.25   Treatment Plan Summary: Daily contact with patient to assess and evaluate symptoms and progress in treatment and Medication management   Ms. Somma is a 40 year old female with a history of depression, anxiety, and substance abuse admitted after suicide attempt by overdose on morphine, Lyrica, and Klonopin.  1. Suicidal ideation. She no longer has suicidal thoughts. She is able to contract for safety in the hospital.  2. Mood. She has been maintained on Zoloft in the community. We added Wellbutrin for depression and added Seroquel for mood stabilization. Will increase to 150 mg tonight.  3. Anxiety. She's been maintained on clonazepam by her outpatient provider. She wants to continue clonazepam. She had clonazepam withdrawal seizures several years ago. We will continue. We will start Minipress for nightmares and flashbacks from PTSD.  4. Hypothyroidism. We will continue Synthroid.  5. Substance abuse. The patient has been clean of all. Since 2013. She infrequently uses alcohol  but it played a role in her overdose. She would like to follow up with outpatient substance abuse program.  6. Metabolic syndrome. Lipid profile, hemoglobin A1c and TSH are normal.  7. Dysmenorrhea. We will give Motrin.   8. Disposition. She will be discharged to home with her family. She will follow up with a local provider.  Huldah Marin 06/18/2015, 3:15 PM

## 2015-06-18 NOTE — [facility] Group Notes (Signed)
[facility] Group Notes:  (Nursing/MHT/Case Management/Adjunct)  Date:  06/18/2015  Time:  9:44 PM  Type of Therapy:  Evening Wrap-up Group  Participation Level:  Active  Participation Quality:  Appropriate  Affect:  Appropriate  Cognitive:  Alert  Insight:  Appropriate  Engagement in Group:  Engaged  Modes of Intervention:  Discussion  Summary of Progress/Problems:  Pt. Discussed meeting her goal in group which was to "work on discharging and is excited to go home and see her twins."  Tomasita MorrowChelsea Nanta Karlynn Furrow 06/18/2015, 9:44 PM

## 2015-06-18 NOTE — Progress Notes (Signed)
D: Patient has been very pleasant this evening. Stated she was here due to morphine o/d. Stated she talked to her son on the phone today. Denies SI/HI/AVH. States menstrual cramps at an 8. MD was called for an ibuprofen order. Participates in groups.  A: Medications was given with education. Encouragement was provided. Medication was given for pain.  R: Patient was cooperative with medication. Patient was remained calm. Safety maintained with 15 min checks.

## 2015-06-18 NOTE — [facility] Group Notes (Signed)
[facility] LCSW Group Therapy  06/18/2015 5:26 PM  Type of Therapy:  Group Therapy  Participation Level:  Did Not Attend  Modes of Intervention:  Discussion, Education, Socialization and Support  Summary of Progress/Problems: Emotional Regulation: Patients will identify both negative and positive emotions. They will discuss emotions they have difficulty regulating and how they impact their lives. Patients will be asked to identify healthy coping skills to combat unhealthy reactions to negative emotions.     Liberty Seto L Kitt Ledet MSW, LCSWA  06/18/2015, 5:26 PM 

## 2015-06-18 NOTE — Progress Notes (Signed)
Recreation Therapy Notes  Date: 01.11.17 Time: 3:00 pm Location: Craft Room  Group Topic: Self-esteem  Goal Area(s) Addresses:  Patient will write at least one positive trait. Patient will verbalize benefit of having a healthy self-esteem.  Behavioral Response: Attentive  Intervention: I Am  Activity: Patients were given a worksheet with the letter I and instructed to write as many positive traits inside the letter as they could.  Education: LRT educated patients on ways they can increase their self-esteem.  Education Outcome: Acknowledges education/In group clarification offered  Clinical Observations/Feedback: Patient wrote positive traits. Patient did not contribute to group discussion.  Jacquelynn CreeGreene,Tnia Anglada M, LRT/CTRS 06/18/2015 4:44 PM
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
## 2015-06-19 MED ORDER — ONDANSETRON HCL 4 MG PO TABS
8.0000 mg | ORAL_TABLET | Freq: Once | ORAL | Status: AC
  Administered 2015-06-19: 8 mg via ORAL
  Filled 2015-06-19: qty 2

## 2015-06-19 NOTE — Tx Team (Signed)
Interdisciplinary Treatment Plan Update (Adult)  Date:  06/19/2015 Time Reviewed:  9:33 AM  Progress in Treatment: Attending groups: No. Participating in groups:  No. Taking medication as prescribed:  Yes. Tolerating medication:  Yes. Family/Significant othe contact made:  No, will contact:  if patient provides consent Patient understands diagnosis:  Yes. Discussing patient identified problems/goals with staff:  Yes. Medical problems stabilized or resolved:  Yes. Denies suicidal/homicidal ideation: Yes. Issues/concerns per patient self-inventory:  No. Other:  New problem(s) identified: No, Describe:  none reported  Discharge Plan or Barriers: Patient will stabilize on medication and discharge home with her husband and children. Patient will need outpatient follow up in [facility].   Reason for Continuation of Hospitalization: Depression Medication stabilization Suicidal ideation  Comments:  Estimated length of stay: 2-3 days with discharge expected Friday 06/20/15  New goal(s):  Review of initial/current patient goals per problem list:   1.  Goal(s):participate in care planning  Met:  No  Target date:by discharge  As evidenced NG:BMBOMQT planning aftercare  2.  Goal (s):decrease signs of depression  Met:  No  Target date:by discharge  As evidenced TC:[id number] decreased signs of depression with a 3 or less out of 10   Attendees: Physician:  Orson Slick, MD 1/12/20179:33 AM  Nursing:   Polly Cobia, RN 1/12/20179:33 AM  Other:  Carmell Austria, Tibes 1/12/20179:33 AM  Other:   1/12/20179:33 AM  Other:   1/12/20179:33 AM  Other:  1/12/20179:33 AM  Other:  1/12/20179:33 AM  Other:  1/12/20179:33 AM  Other:  1/12/20179:33 AM  Other:  1/12/20179:33 AM  Other:  1/12/20179:33 AM  Other:   1/12/20179:33 AM   Scribe for Treatment Team:   Keene Breath, MSW, LCSWA [phone]  06/19/2015, 9:33 AM

## 2015-06-19 NOTE — [facility] Suicide Risk Assessment (Signed)
[facility] INPATIENT:  Family/Significant Other Suicide Prevention Education  Suicide Prevention Education:  Education Completed; Destiny Myers (husband) [phone] has been identified by the patient as the family member/significant other with whom the patient will be residing, and identified as the person(s) who will aid the patient in the event of a mental health crisis (suicidal ideations/suicide attempt).  With written consent from the patient, the family member/significant other has been provided the following suicide prevention education, prior to the and/or following the discharge of the patient.  The suicide prevention education provided includes the following:  Suicide risk factors  Suicide prevention and interventions  National Suicide Hotline telephone number  [facility] assessment telephone number  [facility] [place] Emergency Assistance 911  [facility] [place] and/or Residential Mobile Crisis Unit telephone number  Request made of family/significant other to:  Remove weapons (e.g., guns, rifles, knives), all items previously/currently identified as safety concern.    Remove drugs/medications (over-the-counter, prescriptions, illicit drugs), all items previously/currently identified as a safety concern.  The family member/significant other verbalizes understanding of the suicide prevention education information provided.  The family member/significant other agrees to remove the items of safety concern listed above.  Lulu Ridingngle, Meghin Thivierge T, MSW, Theresia MajorsLCSWA [phone] 06/19/2015, 1:04 PM

## 2015-06-19 NOTE — [facility] Suicide Risk Assessment (Signed)
[facility]   Demographic Factors:  Caucasian and Unemployed  Total Time spent with patient: 30 minutes  Musculoskeletal: Strength & Muscle Tone: within normal limits Gait & Station: normal Patient leans: N/A  Psychiatric Specialty Exam: Physical Exam  Nursing note and vitals reviewed.   Review of Systems  Gastrointestinal: Positive for nausea.  All other systems reviewed and are negative.   Blood pressure 120/72, pulse 76, temperature 97.9 F (36.6 C), temperature source Oral, resp. rate 20, height 5\' 4"  (1.626 m), weight 88.451 kg (195 lb), last menstrual period 05/18/2015, SpO2 96 %.Body mass index is 33.46 kg/(m^2).  General Appearance: Casual  Eye Contact::  Good  Speech:  Clear and Coherent409  Volume:  Normal  Mood:  Euthymic  Affect:  Appropriate  Thought Process:  Goal Directed  Orientation:  Full (Time, Place, and Person)  Thought Content:  WDL  Suicidal Thoughts:  No  Homicidal Thoughts:  No  Memory:  Immediate;   Fair Recent;   Fair Remote;   Fair  Judgement:  Fair  Insight:  Fair  Psychomotor Activity:  Normal  Concentration:  Fair  Recall:  FiservFair  Fund of Knowledge:Fair  Language: Fair  Akathisia:  No  Handed:  Right  AIMS (if indicated):     Assets:  Communication Skills Desire for Improvement Housing Physical Health Resilience Social Support  Sleep:  Number of Hours: 7.25  Cognition: WNL  ADL's:  Intact   Have you used any form of tobacco in the last 30 days? (Cigarettes, Smokeless Tobacco, Cigars, and/or Pipes): Yes  Has this patient used any form of tobacco in the last 30 days? (Cigarettes, Smokeless Tobacco, Cigars, and/or Pipes) Yes, A prescription for an FDA-approved tobacco cessation medication was offered at discharge and the patient refused  Mental Status Per Nursing Assessment::   On Admission:     Current Mental Status by Physician: NA  Loss Factors: NA  Historical Factors: Impulsivity  Risk  Reduction Factors:   Responsible for children under 38 years of age, Sense of responsibility to family, Living with another person, especially a relative and Positive social support  Continued Clinical Symptoms:  Depression:   Comorbid alcohol abuse/dependence Impulsivity Alcohol/Substance Abuse/Dependencies  Cognitive Features That Contribute To Risk:  None    Suicide Risk:  Minimal: No identifiable suicidal ideation.  Patients presenting with no risk factors but with morbid ruminations; may be classified as minimal risk based on the severity of the depressive symptoms  Principal Problem: Severe recurrent major depression without psychotic features [facility](HCC) Discharge Diagnoses:  Patient Active Problem List   Diagnosis Date Noted  . Tobacco use disorder [F17.200] 06/17/2015  . PTSD (post-traumatic stress disorder) [F43.10] 06/17/2015  . Severe recurrent major depression without psychotic features (HCC) [F33.2] 06/16/2015  . Opioid use disorder, mild, abuse [F11.10] 06/16/2015  . Alcohol use disorder, mild, abuse [F10.10] 06/16/2015  . Suicide attempt (HCC) [T14.91] 06/16/2015  . Hypothyroidism [E03.9] 06/16/2015    Follow-up Information    Follow up with [facility]. Go on 06/20/2015.   Why:  For follow-up care appt Friday 06/20/15 at 8:00am for walk in assessment   Contact information:   [address] [place], [place] Ph [phone] Fax [phone] (walk in hours M-F 8am-4pm)       Plan Of Care/Follow-up recommendations:  Activity:  As tolerated. Diet:  Low sodium heart healthy. Other:  Keep follow-up appointments.  Is patient on multiple antipsychotic therapies at discharge:  No   Has Patient had three  or more failed trials of antipsychotic monotherapy by history:  No  Recommended Plan for Multiple Antipsychotic Therapies: NA    Destiny Myers 06/19/2015, 10:54 AM
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
## 2015-06-19 NOTE — Progress Notes (Signed)
Pt discharged home. DC instructions provided and explained. Medications reviewed. Rx given. All questions answered. Denies SI, HI, AVH. Pt stable at discharge 

## 2015-06-19 NOTE — BHH Group Notes (Signed)
[facility] Group Notes:  (Nursing/MHT/Case Management/Adjunct)  Date:  06/19/2015  Time:  9:07 AM  Type of Therapy:  Community Meeting   Participation Level:  Did Not Attend  Destiny Myers [name] 06/19/2015, 9:07 AM

## 2015-06-19 NOTE — Progress Notes (Signed)
Recreation Therapy Notes  INPATIENT RECREATION TR PLAN  Patient Details Name: Destiny Myers MRN: [id number] DOB: March 24, 1976 Today's Date: 06/19/2015  Rec Therapy Plan Is patient appropriate for Therapeutic Recreation?: Yes Treatment times per week: At least once a week TR Treatment/Interventions: 1:1 session, Group participation (Comment) (Appropriate participation in daily recreation therapy tx)  Discharge Criteria Pt will be discharged from therapy if:: Treatment goals are met, Discharged Treatment plan/goals/alternatives discussed and agreed upon by:: Patient/family  Discharge Summary Short term goals set: See Care Plan Short term goals met: Complete Progress toward goals comments: One-to-one attended Which groups?: Coping skills, Leisure education, Self-esteem, Goal setting One-to-one attended: Self-esteem, stress management, coping skills Reason goals not met: N/A Therapeutic equipment acquired: None Reason patient discharged from therapy: Discharge from hospital Pt/family agrees with progress & goals achieved: Yes Date patient discharged from therapy: 06/19/15   [facility], LRT/CTRS 06/19/2015, 4:51 PM

## 2015-06-19 NOTE — Progress Notes (Signed)
D:Pt expressed regret over recent actions, tearful. Cooperative and pleasant. Attended group and compliant with evening meds. Denies SI/HI/ AVH. Stated that she did not intend to OD.  A: Encourage patient participation with unit programming and treatment plan . Instruction Given on Medication , verbalize understanding. Q15 minute checks maintained for safety.  R: Voice no other concerns. Cooperative with treatment. Slept 7.30 Staff continue to monitor

## 2015-06-19 NOTE — Progress Notes (Signed)
Recreation Therapy Notes  Date: 01.12.17 Time: 3:00 pm Location: Craft Room  Group Topic: Leisure Education/ Coping Skills  Goal Area(s) Addresses:  Patient will identify things they are grateful for. Patient will identify how being grateful can influence decision making.  Behavioral Response: Attentive  Intervention: LexicographerGrateful Wheel  Activity: Patients were given an "I Am Grateful For" worksheet and instructed to write at least one thing they are grateful for under each category.  Education:LRT educated patients on leisure and why it is important.  Education Outcome: In group clarification offered.  Clinical Observations/Feedback: Patient wrote at least one thing she was grateful for under each category. Patient did not contribute to group discussion.  Jacquelynn CreeGreene,Dorsey Authement M, LRT/CTRS 06/19/2015 4:20 PM

## 2015-06-19 NOTE — Progress Notes (Signed)
  [facility] :  Will you be returning to the same living situation after discharge:  Yes,  home with husband  At discharge, do you have transportation home?: Yes,  patient's husband will pick up Do you have the ability to pay for your medications: Yes,  patient states her husband is able to pay for her medications.  Release of information consent forms completed and in the chart;  Patient's signature needed at discharge.  Patient to Follow up at: Follow-up Information    Follow up with [facility]. Go on 06/20/2015.   Why:  For follow-up care appt Friday 06/20/15 at 8:00am for walk in assessment   Contact information:   [address] [place], [place] Ph [phone] Fax [phone] (walk in hours M-F 8am-4pm)       Next level of care provider has [facility] to [facility] Link:no  Safety Planning and Suicide Prevention discussed: Yes,  SPE discussed with patient and Mancel BaleLance Lech (husband) [phone]  Have you used any form of tobacco in the last 30 days? (Cigarettes, Smokeless Tobacco, Cigars, and/or Pipes): Yes  Has patient been referred to the Quitline?: Patient refused referral  Patient has been referred for addiction treatment: Yes for outpatient services  Lulu Ridingngle, Yandell Mcjunkins T, MSW, LCSWA 06/19/2015, 1:05 PM

## 2015-06-19 NOTE — Plan of Care (Signed)
Problem: [facility] Participation in Recreation Therapeutic Interventions Goal: STG-Patient will demonstrate improved self esteem by identif STG: Self-Esteem - Within 4 treatment sessions, patient will verbalize at least 5 positive affirmation statements in each of 2 treatment sessions to increase self-esteem post d/c.  Outcome: Completed/Met Date Met:  06/19/15 Treatment Session 2; Completed 2 out of 2: At approximately 9:30 am, LRT met with patient in patient room. Patient verbalized 5 positive affirmation statements. Patient reported it felt "good". LRT encouraged patient to continue saying positive affirmation statements. Intervention Used: I Am statements  [facility], LRT/CTRS 01.12.17 2:05 pm Goal: STG-Patient will identify at least five coping skills for ** STG: Coping Skills - Within 4 treatment sessions, patient will verbalize at least 5 coping skills for substance abuse in each of 2 treatment sessions to decrease substance abuse post d/c.  Outcome: Completed/Met Date Met:  06/19/15 Treatment Session 2; Completed 2 out of 2: At approximately 9:30 am, LRT met with patient in patient room. Patient verbalized 5 coping skills for substance abuse. LRT encouraged patient to participate in leisure activities. Intervention Used: Coping Skills worksheet  [facility], LRT/CTRS 01.12.17 2:07 pm Goal: STG-Other Recreation Therapy Goal (Specify) STG: Stress Management - Within 4 treatment sessions, patient will verbalize understanding of the stress management techniques in each of 2 treatment sessions to increase stress management skills post d/c.  Outcome: Completed/Met Date Met:  06/19/15 Treatment Session 2; Completed 2 out of 2: At approximately 9:30 am, LRT met with patient in patient room. Patient reported she read over and practiced the stress management techniques. Patient verbalized understanding and reported the techniques were helpful. LRT encouraged patient to continue practicing  the stress management techniques. Intervention Used: Stress Management handouts  [facility], LRT/CTRS 01.12.17 2:08 pm

## 2015-06-19 NOTE — [facility] Group Notes (Signed)
[facility] Group Notes:  (Nursing/MHT/Case Management/Adjunct)  Date:  06/19/2015  Time:  2:16 PM  Type of Therapy:  Psychoeducational Skills  Participation Level:  Active  Participation Quality:  Appropriate  Affect:  Appropriate  Cognitive:  Appropriate  Insight:  Appropriate  Engagement in Group:  Engaged  Modes of Intervention:  Discussion and Education  Summary of Progress/Problems:  Destiny Myers M Destiny Myers 06/19/2015, 2:16 PM

## 2015-06-19 NOTE — Tx Team (Signed)
Interdisciplinary Treatment Plan Update (Adult)  Date:  06/19/2015 Time Reviewed:  1:01 PM  Progress in Treatment: Attending groups: Yes. Participating in groups:  Yes. Taking medication as prescribed:  No. Patient refused meds stating her stomach was upset this morning Tolerating medication:  Yes. Family/Significant othe contact made:  Yes, individual(s) contacted:  patient's husband Patient understands diagnosis:  Yes. Discussing patient identified problems/goals with staff:  Yes. Medical problems stabilized or resolved:  Yes. Denies suicidal/homicidal ideation: Yes. Issues/concerns per patient self-inventory:  No. Other:  New problem(s) identified: No, Describe:  none reported  Discharge Plan or Barriers: Patient will stabilize on medication and discharge home with her husband and children. Patient will need outpatient follow up in [facility].   Reason for Continuation of Hospitalization: Depression Medication stabilization Suicidal ideation  Comments:  Estimated length of stay: 0 days with discharge expected today Friday 06/20/15  New goal(s):  Review of initial/current patient goals per problem list:   1.  Goal(s):participate in care planning  Met:  Yes  Target date:by discharge  As evidenced RQ:[id number] planning aftercare  2.  Goal (s):decrease signs of depression  Met:  Yes  Target date:by discharge  As evidenced HN:[id number] decreased signs of depression with a 3 or less out of 10   Attendees: Physician:  Orson Slick, MD 1/12/20171:01 PM  Nursing:   Polly Cobia, RN 1/12/20171:01 PM  Other:  Carmell Austria, [place] 1/12/20171:01 PM  Other:   1/12/20171:01 PM  Other:   1/12/20171:01 PM  Other:  1/12/20171:01 PM  Other:  1/12/20171:01 PM  Other:  1/12/20171:01 PM  Other:  1/12/20171:01 PM  Other:  1/12/20171:01 PM  Other:  1/12/20171:01 PM  Other:   1/12/20171:01 PM   Scribe for Treatment Team:   Keene Breath, MSW, [place] [phone]   06/19/2015, 1:01 PM

## 2016-04-14 ENCOUNTER — Emergency Department: Payer: Self-pay

## 2016-04-14 ENCOUNTER — Emergency Department
Admission: EM | Admit: 2016-04-14 | Discharge: 2016-04-14 | Disposition: A | Discharge status: Home / Self Care | Payer: Self-pay | Attending: Emergency Medicine | Admitting: Emergency Medicine

## 2016-04-14 ENCOUNTER — Encounter: Payer: Self-pay | Admitting: Emergency Medicine

## 2016-04-14 DIAGNOSIS — R109 Unspecified abdominal pain: Secondary | ICD-10-CM | POA: Insufficient documentation

## 2016-04-14 DIAGNOSIS — F17210 Nicotine dependence, cigarettes, uncomplicated: Secondary | ICD-10-CM | POA: Insufficient documentation

## 2016-04-14 DIAGNOSIS — Z79899 Other long term (current) drug therapy: Secondary | ICD-10-CM | POA: Insufficient documentation

## 2016-04-14 DIAGNOSIS — R197 Diarrhea, unspecified: Secondary | ICD-10-CM | POA: Insufficient documentation

## 2016-04-14 DIAGNOSIS — F419 Anxiety disorder, unspecified: Secondary | ICD-10-CM | POA: Insufficient documentation

## 2016-04-14 DIAGNOSIS — E039 Hypothyroidism, unspecified: Secondary | ICD-10-CM | POA: Insufficient documentation

## 2016-04-14 DIAGNOSIS — N12 Tubulo-interstitial nephritis, not specified as acute or chronic: Secondary | ICD-10-CM | POA: Insufficient documentation

## 2016-04-14 DIAGNOSIS — R112 Nausea with vomiting, unspecified: Secondary | ICD-10-CM

## 2016-04-14 LAB — URINALYSIS COMPLETE WITH MICROSCOPIC (ARMC ONLY)
BILIRUBIN URINE: NEGATIVE
Glucose, UA: NEGATIVE mg/dL
Ketones, ur: NEGATIVE mg/dL
NITRITE: NEGATIVE
PH: 6 (ref 5.0–8.0)
Protein, ur: 100 mg/dL — AB
Specific Gravity, Urine: 1.017 (ref 1.005–1.030)

## 2016-04-14 LAB — CBC
HEMATOCRIT: 47.1 % — AB (ref 35.0–47.0)
Hemoglobin: 15.7 g/dL (ref 12.0–16.0)
MCH: 28.4 pg (ref 26.0–34.0)
MCHC: 33.3 g/dL (ref 32.0–36.0)
MCV: 85.3 fL (ref 80.0–100.0)
Platelets: 370 10*3/uL (ref 150–440)
RBC: 5.52 MIL/uL — ABNORMAL HIGH (ref 3.80–5.20)
RDW: 15 % — AB (ref 11.5–14.5)
WBC: 9.5 10*3/uL (ref 3.6–11.0)

## 2016-04-14 LAB — PREGNANCY, URINE: PREG TEST UR: NEGATIVE

## 2016-04-14 LAB — COMPREHENSIVE METABOLIC PANEL
ALBUMIN: 3.9 g/dL (ref 3.5–5.0)
ALK PHOS: 99 U/L (ref 38–126)
ALT: 17 U/L (ref 14–54)
AST: 24 U/L (ref 15–41)
Anion gap: 15 (ref 5–15)
BILIRUBIN TOTAL: 0.4 mg/dL (ref 0.3–1.2)
BUN: 20 mg/dL (ref 6–20)
CALCIUM: 9.3 mg/dL (ref 8.9–10.3)
CO2: 16 mmol/L — ABNORMAL LOW (ref 22–32)
CREATININE: 1.5 mg/dL — AB (ref 0.44–1.00)
Chloride: 105 mmol/L (ref 101–111)
GFR calc Af Amer: 50 mL/min — ABNORMAL LOW (ref >60)
GFR calc non Af Amer: 43 mL/min — ABNORMAL LOW (ref >60)
GLUCOSE: 168 mg/dL — AB (ref 65–99)
Potassium: 3.4 mmol/L — ABNORMAL LOW (ref 3.5–5.1)
Sodium: 136 mmol/L (ref 135–145)
TOTAL PROTEIN: 9.4 g/dL — AB (ref 6.5–8.1)

## 2016-04-14 LAB — LIPASE, BLOOD: Lipase: 38 U/L (ref 11–51)

## 2016-04-14 MED ORDER — DEXTROSE 5 % IV SOLN: 1.0000 g | Freq: Once | INTRAVENOUS | Status: DC

## 2016-04-14 MED ORDER — CLONAZEPAM 1 MG PO TABS: 1.0000 mg | ORAL_TABLET | Freq: Two times a day (BID) | ORAL | 0 refills | Status: DC

## 2016-04-14 MED ORDER — CEFTRIAXONE SODIUM-DEXTROSE 1-3.74 GM-% IV SOLR
INTRAVENOUS | Status: AC
  Filled 2016-04-14: qty 50

## 2016-04-14 MED ORDER — LORAZEPAM 2 MG/ML IJ SOLN
1.0000 mg | Freq: Once | INTRAMUSCULAR | Status: AC
  Administered 2016-04-14: 1 mg via INTRAVENOUS
  Filled 2016-04-14: qty 1

## 2016-04-14 MED ORDER — ONDANSETRON HCL 4 MG/2ML IJ SOLN
4.0000 mg | Freq: Once | INTRAMUSCULAR | Status: AC
  Administered 2016-04-14: 4 mg via INTRAVENOUS

## 2016-04-14 MED ORDER — ONDANSETRON 4 MG PO TBDP: 4.0000 mg | ORAL_TABLET | Freq: Once | ORAL | Status: DC | PRN

## 2016-04-14 MED ORDER — CEFTRIAXONE SODIUM-DEXTROSE 1-3.74 GM-% IV SOLR
1.0000 g | Freq: Once | INTRAVENOUS | Status: AC
  Administered 2016-04-14: 1 g via INTRAVENOUS
  Filled 2016-04-14: qty 50

## 2016-04-14 MED ORDER — ONDANSETRON HCL 4 MG PO TABS: 4.0000 mg | ORAL_TABLET | Freq: Every day | ORAL | 1 refills | Status: DC | PRN

## 2016-04-14 MED ORDER — SODIUM CHLORIDE 0.9 % IV SOLN
Freq: Once | INTRAVENOUS | Status: AC
  Administered 2016-04-14: 15:00:00 via INTRAVENOUS

## 2016-04-14 MED ORDER — ONDANSETRON HCL 4 MG/2ML IJ SOLN
INTRAMUSCULAR | Status: AC
  Filled 2016-04-14: qty 2

## 2016-04-14 MED ORDER — CIPROFLOXACIN HCL 500 MG PO TABS: 500.0000 mg | ORAL_TABLET | Freq: Two times a day (BID) | ORAL | 0 refills | Status: AC

## 2016-04-14 NOTE — ED Provider Notes (Signed)
[facility] Emergency Department Provider Note        Time seen: ----------------------------------------- 2:42 PM on 04/14/2016 -----------------------------------------    I have reviewed the triage vital signs and the nursing notes.   HISTORY  Chief Complaint Emesis; Nausea; and Diarrhea    HPI Destiny Myers is a 40 y.o. female who presents to ER for nausea, vomiting and diarrhea for the last week. Patient states she's had a hard time keeping things down. Patient states the vomiting started over the last 24 hours, prior was just diarrhea. Patient is concerned may be significant anxiety component. She has been using her mother-in-law's Klonopin which she used to be prescribed to help with her anxiety. She denies fevers, chills or other complaints. She has had some abdominal bloating.   Past Medical History:  Diagnosis Date  . Anxiety   . Depression   . Hypothyroidism   . PTSD (post-traumatic stress disorder)     Patient Active Problem List   Diagnosis Date Noted  . Tobacco use disorder 06/17/2015  . PTSD (post-traumatic stress disorder) 06/17/2015  . Severe recurrent major depression without psychotic features (HCC) 06/16/2015  . Opioid use disorder, mild, abuse 06/16/2015  . Alcohol use disorder, mild, abuse 06/16/2015  . Suicide attempt 06/16/2015  . Hypothyroidism 06/16/2015    Past Surgical History:  Procedure Laterality Date  . CESAREAN SECTION    . SHOULDER SURGERY Right     Allergies Sulfa antibiotics and Tylenol [acetaminophen]  Social History Social History  Substance Use Topics  . Smoking status: Current Every Day Smoker    Packs/day: 0.50    Types: Cigarettes  . Smokeless tobacco: Never Used  . Alcohol use Yes    Review of Systems Constitutional: Negative for fever. Cardiovascular: Negative for chest pain. Respiratory: Negative for shortness of breath. Gastrointestinal: Positive for abdominal pain, vomiting  and diarrhea Genitourinary: Negative for dysuria. Musculoskeletal: Negative for back pain. Skin: Negative for rash. Neurological: Negative for headaches, focal weakness or numbness. Psychiatric: Positive for anxiety  10-point ROS otherwise negative.  ____________________________________________   PHYSICAL EXAM:  VITAL SIGNS: ED Triage Vitals [04/14/16 1416]  Enc Vitals Group     BP (!) 158/115     Pulse Rate (!) 139     Resp (!) 22     Temp 98 F (36.7 C)     Temp Source Oral     SpO2 99 %     Weight 180 lb (81.6 kg)     Height 5\' 4"  (1.626 m)     Head Circumference      Peak Flow      Pain Score      Pain Loc      Pain Edu?      Excl. in GC?     Constitutional: Alert and oriented. Well appearing and in no distress. Eyes: Conjunctivae are normal. PERRL. Normal extraocular movements. ENT   Head: Normocephalic and atraumatic.   Nose: No congestion/rhinnorhea.   Mouth/Throat: Mucous membranes are moist.   Neck: No stridor. Cardiovascular: Rapid rate, regular rhythm. No murmurs, rubs, or gallops. Respiratory: Normal respiratory effort without tachypnea nor retractions. Breath sounds are clear and equal bilaterally. No wheezes/rales/rhonchi. Gastrointestinal: Soft and nontender. Normal bowel sounds Musculoskeletal: Nontender with normal range of motion in all extremities. No lower extremity tenderness nor edema. Neurologic:  Normal speech and language. No gross focal neurologic deficits are appreciated.  Skin:  Skin is warm, dry and intact. No rash noted. Psychiatric: Mood and affect  are normal. Speech and behavior are normal.  ____________________________________________  ED COURSE:  Pertinent labs & imaging results that were available during my care of the patient were reviewed by me and considered in my medical decision making (see chart for details). Clinical Course   Patient presents to ER with GI symptoms that are likely somewhat affected or  secondary to anxiety and depression. We will check basic labs, give IV fluids and antiemetics as well as Ativan.  Procedures ____________________________________________   LABS (pertinent positives/negatives)  Labs Reviewed  CBC - Abnormal; Notable for the following:       Result Value   RBC 5.52 (*)    HCT 47.1 (*)    RDW 15.0 (*)    All other components within normal limits  LIPASE, BLOOD  COMPREHENSIVE METABOLIC PANEL  URINALYSIS COMPLETEWITH MICROSCOPIC ([facility] ONLY)  POC URINE PREG, ED    RADIOLOGY Images were viewed by me  CT renal protocol IMPRESSION: Nonobstructive right kidney stone. Minimal right perinephric stranding. Recommend clinical correlation with regards to pyelonephritis.  ____________________________________________  FINAL ASSESSMENT AND PLAN  Abdominal pain, vomiting and diarrhea, anxiety, Pyelonephritis   Plan: Patient with labs and imaging as dictated above. Patient is currently feeling better, she has received saline as well as IV Rocephin. We have sent a urine culture. She'll be discharged with Cipro, Zofran and Klonopin and be referred for outpatient follow-up.   Emily FilbertWilliams, Zavon Hyson E, MD   Note: This dictation was prepared with Dragon dictation. Any transcriptional errors that result from this process are unintentional    Emily FilbertJonathan E Jourdin Connors, MD 04/14/16 1752

## 2016-04-14 NOTE — ED Triage Notes (Signed)
Pt with n/v/d for one week, unable to keep anything down.

## 2016-04-14 NOTE — ED Notes (Signed)
Iv infusing and meds given.  siderails up x 2.

## 2016-04-16 LAB — URINE CULTURE

## 2016-05-31 ENCOUNTER — Encounter: Payer: Self-pay | Admitting: Emergency Medicine

## 2016-05-31 ENCOUNTER — Emergency Department
Admission: EM | Admit: 2016-05-31 | Discharge: 2016-06-01 | Disposition: A | Discharge status: Other Acute Inpatient Hospital | Payer: Self-pay | Attending: Student in an Organized Health Care Education/Training Program | Admitting: Student in an Organized Health Care Education/Training Program

## 2016-05-31 DIAGNOSIS — S61511A Laceration without foreign body of right wrist, initial encounter: Secondary | ICD-10-CM | POA: Insufficient documentation

## 2016-05-31 DIAGNOSIS — N764 Abscess of vulva: Secondary | ICD-10-CM | POA: Insufficient documentation

## 2016-05-31 DIAGNOSIS — R45851 Suicidal ideations: Secondary | ICD-10-CM

## 2016-05-31 DIAGNOSIS — L0291 Cutaneous abscess, unspecified: Secondary | ICD-10-CM

## 2016-05-31 DIAGNOSIS — S61512A Laceration without foreign body of left wrist, initial encounter: Secondary | ICD-10-CM | POA: Insufficient documentation

## 2016-05-31 DIAGNOSIS — Y929 Unspecified place or not applicable: Secondary | ICD-10-CM | POA: Insufficient documentation

## 2016-05-31 DIAGNOSIS — Y999 Unspecified external cause status: Secondary | ICD-10-CM | POA: Insufficient documentation

## 2016-05-31 DIAGNOSIS — F17210 Nicotine dependence, cigarettes, uncomplicated: Secondary | ICD-10-CM | POA: Insufficient documentation

## 2016-05-31 DIAGNOSIS — E039 Hypothyroidism, unspecified: Secondary | ICD-10-CM | POA: Insufficient documentation

## 2016-05-31 DIAGNOSIS — Z79899 Other long term (current) drug therapy: Secondary | ICD-10-CM | POA: Insufficient documentation

## 2016-05-31 DIAGNOSIS — X788XXA Intentional self-harm by other sharp object, initial encounter: Secondary | ICD-10-CM | POA: Insufficient documentation

## 2016-05-31 DIAGNOSIS — T50904A Poisoning by unspecified drugs, medicaments and biological substances, undetermined, initial encounter: Secondary | ICD-10-CM

## 2016-05-31 DIAGNOSIS — Y939 Activity, unspecified: Secondary | ICD-10-CM | POA: Insufficient documentation

## 2016-05-31 DIAGNOSIS — T450X1A Poisoning by antiallergic and antiemetic drugs, accidental (unintentional), initial encounter: Secondary | ICD-10-CM | POA: Insufficient documentation

## 2016-05-31 LAB — COMPREHENSIVE METABOLIC PANEL
ALK PHOS: 82 U/L (ref 38–126)
ALT: 12 U/L — AB (ref 14–54)
AST: 30 U/L (ref 15–41)
Albumin: 3.4 g/dL — ABNORMAL LOW (ref 3.5–5.0)
Anion gap: 7 (ref 5–15)
BUN: 10 mg/dL (ref 6–20)
CHLORIDE: 112 mmol/L — AB (ref 101–111)
CO2: 21 mmol/L — ABNORMAL LOW (ref 22–32)
CREATININE: 0.76 mg/dL (ref 0.44–1.00)
Calcium: 8.4 mg/dL — ABNORMAL LOW (ref 8.9–10.3)
GFR calc Af Amer: >60 mL/min (ref >60)
Glucose, Bld: 197 mg/dL — ABNORMAL HIGH (ref 65–99)
Potassium: 3.2 mmol/L — ABNORMAL LOW (ref 3.5–5.1)
Sodium: 140 mmol/L (ref 135–145)
Total Bilirubin: 0.6 mg/dL (ref 0.3–1.2)
Total Protein: 6.3 g/dL — ABNORMAL LOW (ref 6.5–8.1)

## 2016-05-31 LAB — CBC
HCT: 35.4 % (ref 35.0–47.0)
HEMOGLOBIN: 12 g/dL (ref 12.0–16.0)
MCH: 29.6 pg (ref 26.0–34.0)
MCHC: 34.1 g/dL (ref 32.0–36.0)
MCV: 87 fL (ref 80.0–100.0)
Platelets: 282 10*3/uL (ref 150–440)
RBC: 4.06 MIL/uL (ref 3.80–5.20)
RDW: 16.1 % — ABNORMAL HIGH (ref 11.5–14.5)
WBC: 5.6 10*3/uL (ref 3.6–11.0)

## 2016-05-31 LAB — ETHANOL

## 2016-05-31 LAB — ACETAMINOPHEN LEVEL: Acetaminophen (Tylenol), Serum: <10 ug/mL — ABNORMAL LOW (ref 10–30)

## 2016-05-31 LAB — SALICYLATE LEVEL: Salicylate Lvl: <7 mg/dL (ref 2.8–30.0)

## 2016-05-31 MED ORDER — LIDOCAINE-EPINEPHRINE-TETRACAINE (LET) SOLUTION
NASAL | Status: AC
  Administered 2016-05-31: 6 mL via TOPICAL
  Filled 2016-05-31: qty 9

## 2016-05-31 MED ORDER — POTASSIUM CHLORIDE CRYS ER 20 MEQ PO TBCR
40.0000 meq | EXTENDED_RELEASE_TABLET | Freq: Once | ORAL | Status: AC
  Administered 2016-05-31: 40 meq via ORAL
  Filled 2016-05-31: qty 2

## 2016-05-31 MED ORDER — MAGNESIUM SULFATE 2 GM/50ML IV SOLN
2.0000 g | Freq: Once | INTRAVENOUS | Status: AC
  Administered 2016-05-31: 2 g via INTRAVENOUS
  Filled 2016-05-31: qty 50

## 2016-05-31 MED ORDER — SODIUM CHLORIDE 0.9 % IV BOLUS (SEPSIS)
1000.0000 mL | Freq: Once | INTRAVENOUS | Status: AC
  Administered 2016-05-31: 1000 mL via INTRAVENOUS

## 2016-05-31 MED ORDER — LIDOCAINE-EPINEPHRINE-TETRACAINE (LET) SOLUTION
3.0000 mL | Freq: Once | NASAL | Status: AC
  Administered 2016-05-31: 6 mL via TOPICAL

## 2016-05-31 NOTE — ED Notes (Signed)
[facility]oke with [facility]  ZO:[id number]:anticholinergeics Seizures-benzos Tachycardia-IV fluids Confusion/hallucinations  If negative for tylenol and aspirin we can clear after 6 hours

## 2016-05-31 NOTE — ED Provider Notes (Signed)
[facility] Emergency Department Provider Note    First MD Initiated Contact with Patient 05/31/16 2019     (approximate)  I have reviewed the triage vital signs and the nursing notes.   HISTORY  Chief Complaint Suicidal and Drug Overdose    HPI Destiny AmmonsJennifer K Dulude is a 40 y.o. female  with a history of anxiety and depression presents after intentionally ingesting 60 pills of 25 mg hydroxyzine around 645 and attempt to kill herself. Patient states that she also slipped both wrists trying to hurt herself. Patient is drowsy at this time but able to answer questions. Denies any other coingestion. States that she has tried to kill herself when she was in middle school.   Past Medical History:  Diagnosis Date  . Anxiety   . Depression   . Hypothyroidism   . PTSD (post-traumatic stress disorder)    History reviewed. No pertinent family history. Past Surgical History:  Procedure Laterality Date  . CESAREAN SECTION    . SHOULDER SURGERY Right    Patient Active Problem List   Diagnosis Date Noted  . Tobacco use disorder 06/17/2015  . PTSD (post-traumatic stress disorder) 06/17/2015  . Severe recurrent major depression without psychotic features (HCC) 06/16/2015  . Opioid use disorder, mild, abuse 06/16/2015  . Alcohol use disorder, mild, abuse 06/16/2015  . Suicide attempt 06/16/2015  . Hypothyroidism 06/16/2015      Prior to Admission medications   Medication Sig Start Date End Date Taking? Authorizing Provider  buPROPion (WELLBUTRIN XL) 150 MG 24 hr tablet Take 1 tablet (150 mg total) by mouth daily. 06/18/15   Shari ProwsJolanta B Pucilowska, MD  clonazePAM (KLONOPIN) 1 MG tablet Take 1 mg by mouth 4 (four) times daily.    Historical Provider, MD  clonazePAM (KLONOPIN) 1 MG tablet Take 1 tablet (1 mg total) by mouth 2 (two) times daily. 04/14/16 04/14/17  Emily FilbertJonathan E Williams, MD  cloNIDine (CATAPRES) 0.1 MG tablet Take 0.1 mg by mouth 2 (two) times daily.     Historical Provider, MD  cloNIDine (CATAPRES) 0.1 MG tablet Take 0.2 mg by mouth at bedtime.     Historical Provider, MD  cloNIDine (CATAPRES) 0.1 MG tablet Take 1 tablet (0.1 mg total) by mouth 2 (two) times daily. 06/18/15   Shari ProwsJolanta B Pucilowska, MD  levothyroxine (SYNTHROID, LEVOTHROID) 88 MCG tablet Take 1 tablet (88 mcg total) by mouth daily before breakfast. 06/18/15   Jolanta B Pucilowska, MD  ondansetron (ZOFRAN) 4 MG tablet Take 1 tablet (4 mg total) by mouth daily as needed for nausea or vomiting. 04/14/16   Emily FilbertJonathan E Williams, MD  prazosin (MINIPRESS) 2 MG capsule Take 1 capsule (2 mg total) by mouth 2 (two) times daily. 06/18/15   Shari ProwsJolanta B Pucilowska, MD  QUEtiapine (SEROQUEL) 100 MG tablet Take 1 tablet (100 mg total) by mouth at bedtime. 06/18/15   Shari ProwsJolanta B Pucilowska, MD    Allergies Sulfa antibiotics and Tylenol [acetaminophen]    Social History Social History  Substance Use Topics  . Smoking status: Current Every Day Smoker    Packs/day: 0.50    Types: Cigarettes  . Smokeless tobacco: Never Used  . Alcohol use Yes    Review of Systems Patient denies headaches, rhinorrhea, blurry vision, numbness, shortness of breath, chest pain, edema, cough, abdominal pain, nausea, vomiting, diarrhea, dysuria, fevers, rashes or hallucinations unless otherwise stated above in HPI. ____________________________________________   PHYSICAL EXAM:  VITAL SIGNS: Vitals:   05/31/16 2022  BP: (!) 110/54  Pulse: 87  Resp: 18  Temp: 98.2 F (36.8 C)   Constitutional: Drowsy but able to answer questions appropriately  Eyes: Conjunctivae are normal. PERRL. EOMI. Head: Atraumatic. Nose: No congestion/rhinnorhea. Mouth/Throat: Mucous membranes are moist.  Oropharynx non-erythematous. Neck: No stridor. Painless ROM. No cervical spine tenderness to palpation Hematological/Lymphatic/Immunilogical: No cervical lymphadenopathy. Cardiovascular: Normal rate, regular rhythm. Grossly normal  heart sounds.  Good peripheral circulation. Respiratory: Normal respiratory effort.  No retractions. Lungs CTAB. Gastrointestinal: Soft and nontender. No distention. No abdominal bruits. No CVA tenderness.  Musculoskeletal: No lower extremity tenderness nor edema.  No joint effusions. Neurologic:  Normal speech and language. No gross focal neurologic deficits are appreciated. No gait instability. Skin:  Skin is warm, dry and intact. Left forearm with 6 cm linear laceration that extends to the underlying fascia but no exposure of underlying tendons. It is hemostatic. Psychiatric: Patient states that she is depressed  ____________________________________________   LABS (all labs ordered are listed, but only abnormal results are displayed)  Results for orders placed or performed during the hospital encounter of 05/31/16 (from the past 24 hour(s))  Comprehensive metabolic panel     Status: Abnormal   Collection Time: 05/31/16  8:28 PM  Result Value Ref Range   Sodium 140 135 - 145 mmol/L   Potassium 3.2 (L) 3.5 - 5.1 mmol/L   Chloride 112 (H) 101 - 111 mmol/L   CO2 21 (L) 22 - 32 mmol/L   Glucose, Bld 197 (H) 65 - 99 mg/dL   BUN 10 6 - 20 mg/dL   Creatinine, Ser 8.65 0.44 - 1.00 mg/dL   Calcium 8.4 (L) 8.9 - 10.3 mg/dL   Total Protein 6.3 (L) 6.5 - 8.1 g/dL   Albumin 3.4 (L) 3.5 - 5.0 g/dL   AST 30 15 - 41 U/L   ALT 12 (L) 14 - 54 U/L   Alkaline Phosphatase 82 38 - 126 U/L   Total Bilirubin 0.6 0.3 - 1.2 mg/dL   GFR calc non Af Amer >60 >60 mL/min   GFR calc Af Amer >60 >60 mL/min   Anion gap 7 5 - 15  Ethanol     Status: None   Collection Time: 05/31/16  8:28 PM  Result Value Ref Range   Alcohol, Ethyl (B) <5 <5 mg/dL  Salicylate level     Status: None   Collection Time: 05/31/16  8:28 PM  Result Value Ref Range   Salicylate Lvl <7.0 2.8 - 30.0 mg/dL  Acetaminophen level     Status: Abnormal   Collection Time: 05/31/16  8:28 PM  Result Value Ref Range   Acetaminophen  (Tylenol), Serum <10 (L) 10 - 30 ug/mL  cbc     Status: Abnormal   Collection Time: 05/31/16  8:28 PM  Result Value Ref Range   WBC 5.6 3.6 - 11.0 K/uL   RBC 4.06 3.80 - 5.20 MIL/uL   Hemoglobin 12.0 12.0 - 16.0 g/dL   HCT 78.4 69.6 - 29.5 %   MCV 87.0 80.0 - 100.0 fL   MCH 29.6 26.0 - 34.0 pg   MCHC 34.1 32.0 - 36.0 g/dL   RDW 28.4 (H) 13.2 - 44.0 %   Platelets 282 150 - 440 K/uL   ____________________________________________  EKG My review and personal interpretation at Time: 20:35   Indication: overdose  Rate: 85  Rhythm: sinus Axis: normal Other: non specific st changes, prolonged QT ____________________________________________  RADIOLOGY   ____________________________________________   PROCEDURES  Procedure(s) performed:  Marland KitchenMarland KitchenLaceration Repair  Date/Time: 05/31/2016 10:52 PM Performed by: Willy Eddy Authorized by: Willy Eddy   Consent:    Consent obtained:  Verbal   Consent given by:  Patient Laceration details:    Location:  Shoulder/arm   Shoulder/arm location:  R lower arm   Length (cm):  6   Depth (mm):  2 Repair type:    Repair type:  Simple Pre-procedure details:    Preparation:  Patient was prepped and draped in usual sterile fashion Exploration:    Hemostasis achieved with:  LET   Contaminated: no   Treatment:    Area cleansed with:  Betadine   Amount of cleaning:  Standard   Irrigation solution:  Tap water and sterile saline Skin repair:    Repair method:  Sutures and Steri-Strips   Suture size:  5-0   Suture material:  Nylon   Suture technique:  Simple interrupted   Number of sutures:  10   Number of Steri-Strips:  3 Approximation:    Approximation:  Close   Vermilion border: well-aligned   Post-procedure details:    Dressing:  Sterile dressing   Patient tolerance of procedure:  Tolerated well, no immediate complications  .Marland KitchenLaceration Repair Date/Time: 06/01/2016 12:45 AM Performed by: Willy Eddy Authorized by:  Willy Eddy   Consent:    Consent obtained:  Verbal   Consent given by:  Patient Laceration details:    Location:  Shoulder/arm   Shoulder/arm location:  R lower arm   Length (cm):  5   Depth (mm):  1 Repair type:    Repair type:  Simple Treatment:    Area cleansed with:  Betadine Skin repair:    Repair method:  Tissue adhesive Approximation:    Approximation:  Close   Vermilion border: well-aligned   Post-procedure details:    Dressing:  Open (no dressing)      Critical Care performed: no ____________________________________________   INITIAL IMPRESSION / ASSESSMENT AND PLAN / ED COURSE  Pertinent labs & imaging results that were available during my care of the patient were reviewed by me and considered in my medical decision making (see chart for details).  DDX: Psychosis, delirium, medication effect, noncompliance, polysubstance abuse, Si, Hi, depression   WENDE LONGSTRETH is a 40 y.o. who presents to the ED with for evaluation of SI and overdose with cutting of wrist. Patient has been observed in the ER and her drowsiness has cleared with metabolization of the hydroxyzine. No interventions required. She did have magnesium infused for prolonged QT which resolved. She remains hemodynamically stable. Laceration repaired. As described above.  Patient has psych history of PTSD,anxiety and depression .  Laboratory testing was ordered to evaluation for underlying electrolyte derangement or signs of underlying organic pathology to explain today's presentation.  Based on history and physical and laboratory evaluation, it appears that the patient's presentation is 2/2 underlying psychiatric disorder and will require further evaluation and management by inpatient psychiatry.  Patient was  made an IVC due to SI and overdose.  Patient is medically clear. Disposition pending psychiatric evaluation.    Clinical Course       ____________________________________________   FINAL CLINICAL IMPRESSION(S) / ED DIAGNOSES  Final diagnoses:  Drug overdose, undetermined intent, initial encounter  Suicidal ideations  Laceration of right wrist, initial encounter  Laceration of left wrist, initial encounter      NEW MEDICATIONS STARTED DURING THIS VISIT:  New Prescriptions   No medications on file     Note:  This document was  prepared using Conservation officer, historic buildingsDragon voice recognition software and may include unintentional dictation errors.    Willy EddyPatrick Alicya Bena, MD 06/01/16 [phone]

## 2016-05-31 NOTE — ED Notes (Signed)
Pt with bandage post suture for left wrist laceration

## 2016-05-31 NOTE — ED Notes (Signed)
Pt advised by officer, nurse, and MD to seek safety upon discharge; pt leaning towards spouse after discharge

## 2016-05-31 NOTE — ED Triage Notes (Signed)
Pt presents to ED via EMS from home c/o ingesting 60 pilla od hydroxyzine hcl 25 mg around 1845. Pt presents with blt wirst cuts ; left wrist bandaged on arrival.

## 2016-06-01 ENCOUNTER — Inpatient Hospital Stay
Admission: AD | Admit: 2016-06-01 | Discharge: 2016-06-04 | DRG: 885 | Disposition: A | Discharge status: Home / Self Care | Payer: No Typology Code available for payment source | Source: Intra-hospital | Attending: Psychiatry | Admitting: Psychiatry

## 2016-06-01 DIAGNOSIS — T43592A Poisoning by other antipsychotics and neuroleptics, intentional self-harm, initial encounter: Secondary | ICD-10-CM | POA: Diagnosis present

## 2016-06-01 DIAGNOSIS — F1410 Cocaine abuse, uncomplicated: Secondary | ICD-10-CM | POA: Diagnosis present

## 2016-06-01 DIAGNOSIS — X789XXA Intentional self-harm by unspecified sharp object, initial encounter: Secondary | ICD-10-CM | POA: Diagnosis present

## 2016-06-01 DIAGNOSIS — Y929 Unspecified place or not applicable: Secondary | ICD-10-CM

## 2016-06-01 DIAGNOSIS — F332 Major depressive disorder, recurrent severe without psychotic features: Principal | ICD-10-CM | POA: Diagnosis present

## 2016-06-01 DIAGNOSIS — E039 Hypothyroidism, unspecified: Secondary | ICD-10-CM | POA: Diagnosis present

## 2016-06-01 DIAGNOSIS — T1491XA Suicide attempt, initial encounter: Secondary | ICD-10-CM | POA: Diagnosis present

## 2016-06-01 DIAGNOSIS — F17210 Nicotine dependence, cigarettes, uncomplicated: Secondary | ICD-10-CM | POA: Diagnosis present

## 2016-06-01 DIAGNOSIS — S61519A Laceration without foreign body of unspecified wrist, initial encounter: Secondary | ICD-10-CM

## 2016-06-01 DIAGNOSIS — F4310 Post-traumatic stress disorder, unspecified: Secondary | ICD-10-CM | POA: Diagnosis present

## 2016-06-01 DIAGNOSIS — F1420 Cocaine dependence, uncomplicated: Secondary | ICD-10-CM | POA: Diagnosis present

## 2016-06-01 DIAGNOSIS — S61512A Laceration without foreign body of left wrist, initial encounter: Secondary | ICD-10-CM | POA: Diagnosis present

## 2016-06-01 LAB — URINE DRUG SCREEN, QUALITATIVE (ARMC ONLY)
Amphetamines, Ur Screen: NOT DETECTED
BARBITURATES, UR SCREEN: NOT DETECTED
Benzodiazepine, Ur Scrn: NOT DETECTED
CANNABINOID 50 NG, UR ~~LOC~~: POSITIVE — AB
COCAINE METABOLITE, UR ~~LOC~~: POSITIVE — AB
MDMA (ECSTASY) UR SCREEN: NOT DETECTED
Methadone Scn, Ur: NOT DETECTED
OPIATE, UR SCREEN: NOT DETECTED
PHENCYCLIDINE (PCP) UR S: NOT DETECTED
Tricyclic, Ur Screen: NOT DETECTED

## 2016-06-01 LAB — POCT PREGNANCY, URINE: Preg Test, Ur: NEGATIVE

## 2016-06-01 MED ORDER — SERTRALINE HCL 100 MG PO TABS
100.0000 mg | ORAL_TABLET | Freq: Every day | ORAL | Status: DC
  Administered 2016-06-01: 100 mg via ORAL
  Filled 2016-06-01: qty 1

## 2016-06-01 MED ORDER — CLINDAMYCIN HCL 150 MG PO CAPS
300.0000 mg | ORAL_CAPSULE | Freq: Three times a day (TID) | ORAL | Status: DC
  Administered 2016-06-01: 300 mg via ORAL
  Filled 2016-06-01 (×3): qty 2

## 2016-06-01 MED ORDER — LORAZEPAM 1 MG PO TABS: 1.0000 mg | ORAL_TABLET | ORAL | Status: DC | PRN

## 2016-06-01 MED ORDER — IBUPROFEN 800 MG PO TABS
800.0000 mg | ORAL_TABLET | Freq: Once | ORAL | Status: AC
  Administered 2016-06-01: 800 mg via ORAL

## 2016-06-01 MED ORDER — ALUM & MAG HYDROXIDE-SIMETH 200-200-20 MG/5ML PO SUSP: 30.0000 mL | ORAL | Status: DC | PRN

## 2016-06-01 MED ORDER — SERTRALINE HCL 100 MG PO TABS
100.0000 mg | ORAL_TABLET | Freq: Every day | ORAL | Status: DC
  Administered 2016-06-02: 100 mg via ORAL
  Filled 2016-06-01: qty 1

## 2016-06-01 MED ORDER — OLANZAPINE 5 MG PO TABS
5.0000 mg | ORAL_TABLET | Freq: Every day | ORAL | Status: DC
  Administered 2016-06-01: 5 mg via ORAL
  Filled 2016-06-01: qty 1

## 2016-06-01 MED ORDER — ONDANSETRON 4 MG PO TBDP
4.0000 mg | ORAL_TABLET | Freq: Once | ORAL | Status: AC
  Administered 2016-06-01: 4 mg via ORAL
  Filled 2016-06-01: qty 1

## 2016-06-01 MED ORDER — METRONIDAZOLE 500 MG PO TABS
2000.0000 mg | ORAL_TABLET | Freq: Once | ORAL | Status: AC
  Administered 2016-06-01: 2000 mg via ORAL
  Filled 2016-06-01: qty 4

## 2016-06-01 MED ORDER — IBUPROFEN 800 MG PO TABS
ORAL_TABLET | ORAL | Status: AC
  Administered 2016-06-01: 800 mg via ORAL
  Filled 2016-06-01: qty 1

## 2016-06-01 MED ORDER — MAGNESIUM HYDROXIDE 400 MG/5ML PO SUSP: 30.0000 mL | Freq: Every day | ORAL | Status: DC | PRN

## 2016-06-01 MED ORDER — CLINDAMYCIN HCL 300 MG PO CAPS: 300.0000 mg | ORAL_CAPSULE | Freq: Three times a day (TID) | ORAL | 0 refills | Status: AC

## 2016-06-01 MED ORDER — CLINDAMYCIN HCL 150 MG PO CAPS
300.0000 mg | ORAL_CAPSULE | Freq: Three times a day (TID) | ORAL | Status: DC
  Administered 2016-06-01 – 2016-06-02 (×3): 300 mg via ORAL
  Filled 2016-06-01 (×4): qty 1

## 2016-06-01 MED ORDER — LORAZEPAM 1 MG PO TABS
1.0000 mg | ORAL_TABLET | ORAL | Status: DC | PRN
  Administered 2016-06-01: 1 mg via ORAL
  Filled 2016-06-01: qty 1

## 2016-06-01 MED ORDER — ACETAMINOPHEN 325 MG PO TABS
650.0000 mg | ORAL_TABLET | Freq: Once | ORAL | Status: AC
Start: 2016-06-01 — End: 2016-06-01
  Administered 2016-06-01: 650 mg via ORAL
  Filled 2016-06-01: qty 2

## 2016-06-01 MED ORDER — LIDOCAINE-EPINEPHRINE 2 %-1:100000 IJ SOLN
INTRAMUSCULAR | Status: AC
  Administered 2016-06-01: 10:00:00
  Filled 2016-06-01: qty 1.7

## 2016-06-01 MED ORDER — ACETAMINOPHEN 325 MG PO TABS: 650.0000 mg | ORAL_TABLET | Freq: Four times a day (QID) | ORAL | Status: DC | PRN

## 2016-06-01 NOTE — ED Notes (Signed)
PT IVC/ PENDING PLACEMENT  

## 2016-06-01 NOTE — ED Notes (Signed)
Patient stated that she and her husband separated for a month,she expected him on christmas but he did want to come back.Patient stated that she regretted about her action.

## 2016-06-01 NOTE — Progress Notes (Signed)
[facility] Toni Amendlapacs.  Attending Physician will be Dr. Jennet MaduroPucilowska.   Patient has been [facility] to room 323, [facility]wenn.    ER staff is aware of the admission (ER Sect.; ER MD;  Patient's Nurse & Patient Access). Nymir Ringler K. Sherlon HandingHarris, LCAS-A, LPC-A, [facility]  Counselor 06/01/2016 3:55 PM
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
## 2016-06-01 NOTE — ED Notes (Signed)
[facility] given [facility] info, medically cleared pt

## 2016-06-01 NOTE — ED Notes (Signed)
Patient assigned to appropriate care area. Patient oriented to unit/care area: Informed that, for their safety, care areas are designed for safety and monitored by security cameras at all times; and visiting hours explained to patient. Patient verbalizes understanding, and verbal contract for safety obtained. 

## 2016-06-01 NOTE — ED Provider Notes (Signed)
Patient told the nurse that she was started yesterday on Flagyl for diagnosis of Trichomonas, and not bacterial vaginosis.  Pharmacy tech spoke with pharmacy and found out that the prescription was 250 mg 3 times daily for 1 week.  I spoke with the patient myself and it sounds like she was told specifically that she did not have bacteria vaginosis. I offered her one time 2 g dosing, and she chose to take this. She was given a dose of Zofran as well.  She also asked me to check her for a small bump on her left labia which had been draining for a few days and now it has stopped draining but seems a tiny bit larger to her.  I visualized the spot, location-wise it does not appear to be a Bartholin cyst, is actually in the labia itself and appears to be a small abscess that was draining and had scabbed over. I tried needle aspiration and nothing came out. I discussed I&D, and did anesthetize the area with lidocaine with epinephrine and then used a 15 blade to make a small incision, minimal drainage, perhaps mostly induration. In any case, at that point she told me that she has a early starting spot under her left armpit where she has been shaving. The armpit looks like there are several areas of cellulitis, and then one area that seems to be a little bit more formed although not fluctuant at this point in time. Given the multiple areas, I discussed with her going on antibiotic to cover for MRSA and she was agreeable to this. I did discuss taking probiotics to help recolonize after antibiotics.   Patient was moved to behavior holding awaiting psychiatric disposition, [facility].    INCISION AND DRAINAGE Performed by: Governor RooksLORD, Kelen Laura Consent: Verbal consent obtained. Risks and benefits: risks, benefits and alternatives were discussed Type: abscess  Body area: Left labia  Anesthesia: local infiltration  Incision was made with a  scalpel.  Local anesthetic: lidocaine 1% with epinephrine  Anesthetic total: 1 ml  Complexity: Simple Drainage: Tiny amount of pus   Patient tolerance: Patient tolerated the procedure well with no immediate complications.     Governor Rooksebecca Loghan Kurtzman, MD 06/01/16 [phone]

## 2016-06-01 NOTE — ED Notes (Signed)
Dressing removed from left forearm. Scant yellow-clear drainage noted on old dressing. Sutures dry and intact. Covered with two 1x3 bandaids

## 2016-06-01 NOTE — ED Notes (Signed)
Sherilyn CooterHenry RN tried to call report. Receiving RN was not available.

## 2016-06-01 NOTE — ED Provider Notes (Signed)
-----------------------------------------   12:51 AM on 06/01/2016 -----------------------------------------  [facility] inpatient hospitalization. Recommends lorazepam now, prn, and zyprexa. Will hold lorazepam now as patient is sleeping.   ----------------------------------------- 6:29 AM on 06/01/2016 -----------------------------------------   Blood pressure (!) 102/57, pulse 88, temperature 98.2 F (36.8 C), temperature source Oral, resp. rate 20, height 5\' 4"  (1.626 m), weight 180 lb (81.6 kg), SpO2 98 %.  No events overnight.  Calm and cooperative at this time.  Disposition is pending Psychiatry/Behavioral Medicine team recommendations.     Irean HongJade J Debroh Sieloff, MD 06/01/16 [phone]

## 2016-06-01 NOTE — ED Notes (Signed)
Dressed pt out in burgundy scrubs without incident.  Pt has 1 dermal facial piercing that cannot be removed.  No jewelry, purse, wallet or other valuables.  Clothing only

## 2016-06-01 NOTE — ED Notes (Signed)
Report given to qwen rn

## 2016-06-01 NOTE — ED Notes (Signed)
Windell MouldingRuth RN states that she already gave report to the behavioral unit, waiting for the change of shift to transfer the Pt.

## 2016-06-01 NOTE — ED Notes (Signed)
At bedside with MD Shaune PollackLord for cyst drainage on L labia. Epi used. Patient tolerated well. MD prescribing antibiotic to decrease risk of infection

## 2016-06-01 NOTE — ED Notes (Signed)
Report given to behavioral nurse Ati, he requested for the Pt to be transported to behavioral unit at 21:15.

## 2016-06-02 DIAGNOSIS — F1420 Cocaine dependence, uncomplicated: Secondary | ICD-10-CM | POA: Diagnosis present

## 2016-06-02 DIAGNOSIS — S61519A Laceration without foreign body of unspecified wrist, initial encounter: Secondary | ICD-10-CM

## 2016-06-02 DIAGNOSIS — F332 Major depressive disorder, recurrent severe without psychotic features: Principal | ICD-10-CM

## 2016-06-02 DIAGNOSIS — X789XXA Intentional self-harm by unspecified sharp object, initial encounter: Secondary | ICD-10-CM

## 2016-06-02 LAB — LIPID PANEL
CHOL/HDL RATIO: 2.7 ratio
Cholesterol: 178 mg/dL (ref 0–200)
HDL: 66 mg/dL (ref >40)
LDL CALC: 102 mg/dL — AB (ref 0–99)
Triglycerides: 48 mg/dL (ref <150)
VLDL: 10 mg/dL (ref 0–40)

## 2016-06-02 LAB — TSH: TSH: 3.385 u[IU]/mL (ref 0.350–4.500)

## 2016-06-02 MED ORDER — TRAZODONE HCL 100 MG PO TABS
100.0000 mg | ORAL_TABLET | Freq: Every evening | ORAL | Status: DC | PRN
  Administered 2016-06-03: 100 mg via ORAL
  Filled 2016-06-02: qty 1

## 2016-06-02 MED ORDER — SERTRALINE HCL 50 MG PO TABS
150.0000 mg | ORAL_TABLET | Freq: Every day | ORAL | Status: DC
  Administered 2016-06-03 – 2016-06-04 (×2): 150 mg via ORAL
  Filled 2016-06-02 (×2): qty 1

## 2016-06-02 MED ORDER — CEPHALEXIN 500 MG PO CAPS
500.0000 mg | ORAL_CAPSULE | Freq: Three times a day (TID) | ORAL | Status: DC
  Administered 2016-06-02 – 2016-06-03 (×4): 500 mg via ORAL
  Filled 2016-06-02 (×5): qty 1
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
## 2016-06-02 NOTE — Plan of Care (Signed)
Problem: Safety: Goal: Ability to remain free from injury will improve Outcome: Progressing Patient slept for Estimated Hours of 6.15; safety maintained, no injury or falls during this shift.

## 2016-06-02 NOTE — Tx Team (Signed)
Interdisciplinary Treatment and Diagnostic Plan Update  06/02/2016 Time of Session: 10:30am Destiny Myers MRN: [id number]  Principal Diagnosis: Severe recurrent major depression without psychotic features [facility](HCC)  Secondary Diagnoses: Principal Problem:   Severe recurrent major depression without psychotic features (HCC) Active Problems:   Suicide attempt   PTSD (post-traumatic stress disorder)   Cocaine abuse   Self-inflicted laceration of wrist   Current Medications:  Current Facility-Administered Medications  Medication Dose Route Frequency Provider Last Rate Last Dose  . acetaminophen (TYLENOL) tablet 650 mg  650 mg Oral Q6H PRN Audery AmelJohn T Clapacs, MD      . alum & mag hydroxide-simeth (MAALOX/MYLANTA) 200-200-20 MG/5ML suspension 30 mL  30 mL Oral Q4H PRN Audery AmelJohn T Clapacs, MD      . clindamycin (CLEOCIN) capsule 300 mg  300 mg Oral Q8H Audery AmelJohn T Clapacs, MD   300 mg at 06/02/16 1201  . LORazepam (ATIVAN) tablet 1 mg  1 mg Oral Q4H PRN Audery AmelJohn T Clapacs, MD   1 mg at 06/01/16 2323  . magnesium hydroxide (MILK OF MAGNESIA) suspension 30 mL  30 mL Oral Daily PRN Audery AmelJohn T Clapacs, MD      . OLANZapine (ZYPREXA) tablet 5 mg  5 mg Oral QHS Audery AmelJohn T Clapacs, MD   5 mg at 06/01/16 2323  . sertraline (ZOLOFT) tablet 100 mg  100 mg Oral Daily Audery AmelJohn T Clapacs, MD   100 mg at 06/02/16 [id number]   PTA Medications: Prescriptions Prior to Admission  Medication Sig Dispense Refill Last Dose  . buPROPion (WELLBUTRIN XL) 150 MG 24 hr tablet Take 1 tablet (150 mg total) by mouth daily. 30 tablet 0 unknown at unknown  . clindamycin (CLEOCIN) 300 MG capsule Take 1 capsule (300 mg total) by mouth 3 (three) times daily. 21 capsule 0   . clonazePAM (KLONOPIN) 1 MG tablet Take 1 mg by mouth 4 (four) times daily.   unknown at unknown  . clonazePAM (KLONOPIN) 1 MG tablet Take 1 tablet (1 mg total) by mouth 2 (two) times daily. 20 tablet 0 unknown at unknown  . cloNIDine (CATAPRES) 0.1 MG tablet Take 0.2 mg by mouth at  bedtime.    unknown at unknown  . cloNIDine (CATAPRES) 0.1 MG tablet Take 1 tablet (0.1 mg total) by mouth 2 (two) times daily. 60 tablet 11 unknown at unknown  . levothyroxine (SYNTHROID, LEVOTHROID) 88 MCG tablet Take 1 tablet (88 mcg total) by mouth daily before breakfast. 30 tablet 0 unknown at unknown  . metroNIDAZOLE (FLAGYL) 250 MG tablet Take 250 mg by mouth 3 (three) times daily.   unknown at unknown  . ondansetron (ZOFRAN) 4 MG tablet Take 1 tablet (4 mg total) by mouth daily as needed for nausea or vomiting. 20 tablet 1 unknown at unknown  . oxyCODONE (OXY IR/ROXICODONE) 5 MG immediate release tablet Take 5 mg by mouth every 6 (six) hours as needed for severe pain.   unknown at unknown  . prazosin (MINIPRESS) 2 MG capsule Take 1 capsule (2 mg total) by mouth 2 (two) times daily. 60 capsule 0 unknown at unknown  . QUEtiapine (SEROQUEL) 100 MG tablet Take 1 tablet (100 mg total) by mouth at bedtime. 30 tablet 0 unknown at unknown  . sertraline (ZOLOFT) 100 MG tablet Take 100 mg by mouth daily.   unknown at unknown    Patient Stressors: Educational concerns Financial difficulties Marital or family conflict Medication change or noncompliance Substance abuse Traumatic event  Patient Strengths: Ability for insight Average or  above average intelligence Capable of independent living Motivation for treatment/growth Supportive family/friends  Treatment Modalities: Medication Management, Group therapy, Case management,  1 to 1 session with clinician, Psychoeducation, Recreational therapy.   Physician Treatment Plan for Primary Diagnosis: Severe recurrent major depression without psychotic features (HCC) Long Term Goal(s): Improvement in symptoms so as ready for discharge Improvement in symptoms so as ready for discharge   Short Term Goals: Ability to verbalize feelings will improve Ability to disclose and discuss suicidal ideas Ability to identify and develop effective coping  behaviors will improve Ability to maintain clinical measurements within normal limits will improve  Medication Management: Evaluate patient's response, side effects, and tolerance of medication regimen.  Therapeutic Interventions: 1 to 1 sessions, Unit Group sessions and Medication administration.  Evaluation of Outcomes: Progressing  Physician Treatment Plan for Secondary Diagnosis: Active Problems:   Severe recurrent major depression without psychotic features (HCC)  Long Term Goal(s): Improvement in symptoms so as ready for discharge Improvement in symptoms so as ready for discharge   Short Term Goals: Ability to verbalize feelings will improve Ability to disclose and discuss suicidal ideas Ability to identify and develop effective coping behaviors will improve Ability to maintain clinical measurements within normal limits will improve     Medication Management: Evaluate patient's response, side effects, and tolerance of medication regimen.  Therapeutic Interventions: 1 to 1 sessions, Unit Group sessions and Medication administration.  Evaluation of Outcomes: Progressing   RN Treatment Plan for Primary Diagnosis: Severe recurrent major depression without psychotic features (HCC) Long Term Goal(s): Knowledge of disease and therapeutic regimen to maintain health will improve  Short Term Goals: Ability to participate in decision making will improve, Ability to verbalize feelings will improve and Ability to disclose and discuss suicidal ideas  Medication Management: RN will administer medications as ordered by provider, will assess and evaluate patient's response and provide education to patient for prescribed medication. RN will report any adverse and/or side effects to prescribing provider.  Therapeutic Interventions: 1 on 1 counseling sessions, Psychoeducation, Medication administration, Evaluate responses to treatment, Monitor vital signs and CBGs as ordered, Perform/monitor  CIWA, COWS, AIMS and Fall Risk screenings as ordered, Perform wound care treatments as ordered.  Evaluation of Outcomes: Progressing   LCSW Treatment Plan for Primary Diagnosis: Severe recurrent major depression without psychotic features (HCC) Long Term Goal(s): Safe transition to appropriate next level of care at discharge, Engage patient in therapeutic group addressing interpersonal concerns.  Short Term Goals: Engage patient in aftercare planning with referrals and resources, Increase social support, Increase ability to appropriately verbalize feelings, Increase emotional regulation, Facilitate acceptance of mental health diagnosis and concerns and Increase skills for wellness and recovery  Therapeutic Interventions: Assess for all discharge needs, 1 to 1 time with Social worker, Explore available resources and support systems, Assess for adequacy in community support network, Educate family and significant other(s) on suicide prevention, Complete Psychosocial Assessment, Interpersonal group therapy.  Evaluation of Outcomes: Progressing   Progress in Treatment: Attending groups: Yes. Participating in groups: Yes. Taking medication as prescribed: Yes. Toleration medication: Yes. Family/Significant other contact made: No, will contact:    Patient understands diagnosis: Yes. Discussing patient identified problems/goals with staff: Yes. Medical problems stabilized or resolved: Yes. Denies suicidal/homicidal ideation: Yes. Issues/concerns per patient self-inventory: No. Other:    New problem(s) identified: No, Describe:     New Short Term/Long Term Goal(s):  Discharge Plan or Barriers: Home with friend and follow up at [facility] with Psychiatrist and therapist appointment on  06/17/2016  Reason for Continuation of Hospitalization: Depression Suicidal ideation Other; describe recent suicide attempt  Estimated Length of Stay:2-3 days  Attendees: Patient:Destiny Myers 06/02/2016 1:03  PM  Physician: Mordecai RasmussenJohn Clapacs MD 06/02/2016 1:03 PM  Nursing: Elenore PaddyJennifer Morrow, RN 06/02/2016 1:03 PM  RN Care Manager: 06/02/2016 1:03 PM  Social Worker: Hampton AbbotKadijah Kavi Almquist, LCSW-A 06/02/2016 1:03 PM  Recreational Therapist: Hershal CoriaBeth Greene, LRT 06/02/2016 1:03 PM    Scribe for Treatment Team: Hampton AbbotKadijah Cheng Dec, MSW, LCSW-A 06/03/2016 11:17AM
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
## 2016-06-02 NOTE — Progress Notes (Signed)
Recreation Therapy Notes  Date: 12.27.17 Time: 9:30 am Location: Craft Room  Group Topic: Self-esteem  Goal Area(s) Addresses:  Patient will write at least one positive trait about self. Patient will verbalize benefit of having healthy self-esteem.  Behavioral Response: Did not attend  Intervention: I Am  Activity: Patients were given a worksheet with the letter I on it and were instructed to write positive traits about themselves inside the letter.  Education: LRT educated patients on ways they can increase their self-esteem.  Education Outcome: Patient did not attend group.  Clinical Observations/Feedback: Patient did not attend group.  Jacquelynn CreeGreene,Nahiara Kretzschmar M, LRT/CTRS 06/02/2016 11:39 AM
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
## 2016-06-02 NOTE — Care Management Note (Signed)
Case Management Note  Patient Details  Name: Destiny Myers MRN: [id number] Date of Birth: September 18, 1975  Subjective/Objective:                    Action/Plan:   Expected Discharge Date:  06/04/16               Expected Discharge Plan:     In-House Referral:     Discharge planning Services  Medication Assistance (SPOKE TO GREG LCSW, WHO STATES HE WILL HANDLE THIS)  Post Acute Care Choice:    Choice offered to:     DME Arranged:    DME Agency:     HH Arranged:    HH Agency:     Status of Service:  Completed, signed off  If discussed at [employer] Length of Stay Meetings, dates discussed:    Additional Comments:  Arty BaumgartnerHolland, Ajene Carchi Moon, RN 06/02/2016, 12:41 PM

## 2016-06-02 NOTE — Progress Notes (Addendum)
Patient with sad affect, cooperative with meals, meds and plan of care. No SI/Hi at this time. No SI/HI/SH at this time. Brief orientation to plan of care and therapeutic milieu. Discuss with patient coping skills to avoid self harming behaviors. Verbalizes understanding. Safety maintained. Patient to discharge when discharge plan and transportation by husband in place. Acknowledges all belongings have been returned.

## 2016-06-02 NOTE — Progress Notes (Signed)
Patient ID: Destiny AmmonsJennifer K Verret, female   DOB: 1975-09-20, 40 y.o.   MRN: [id number] Patient was last admitted to [facility] in 06/16/2015 and now for MDD with an attempted suicide: OD on hydroxyzine 40 tablets and cut her wrists. Her right wrist is noted with superficial cuts, open to air; but left hand and wrist is covered with bandaged. Records indicated that the site required 10 surgical stitches plus Steri-Strip and additional butterfly closure. Positive for Cocaine and THC, BAL<5. H/o PTSD, Anxiety, Depression and Hypothyroidisim.  Estranged from husband and her 668 year old twins Research officer, trade union([place]riel - female and [place] - female), "I left home to stay with a female friend, just a friend, nothing more after we had one of our arguments, and when I came back my husband change the lock (Crying now but consolable). He is a Arts development officerMarine and on disability... 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 "I will not attempt suicide again because of my kids..." Smart, eloquent, articulate, thoughts are organized, easy to follow, insight and judgment as a married woman staying with a female friend may be poor in her decision making.  Oriented to the unit, skin assessment and contrabands search completed by Ms Olin PiaVeronique Byungura, RN and another female staff to be clinically appropriate/dignity. No skin wound except as aforementioned, no contrabands found on patient and her belongings.   Clindamycin capsule 300 mg "I have an abscess down there and they drained it, that's why I am on antibiotics.." Ativan 1 mg and low dose of 5 mg of Zyprexa for mood stabilization given, will monitor every 15 minutes for safety.

## 2016-06-02 NOTE — B[facility] Group Notes (Signed)
[facility] LCSW Group Therapy  1:00PM Type of Therapy: Group Therapy   Participation Level: Patient invited but did not attend.   Summary of Progress/Problems: The topic for group today was emotional regulation. This group focused on both positive and negative emotion identification and allowed  group members to process ways to identify feelings, regulate negative emotions, and find healthy ways to manage internal/external emotions. Group members were asked to reflect on a time when their reaction to an emotion led to a negative outcome and explored how alternative responses using emotion regulation would have benefited them. Group members were also asked to discuss a time when emotion regulation was utilized when a negative emotion was experienced.   Jonathon JordanLynn B Edder Bellanca, MSW, LCSWA 06/02/16, 3:11 PM

## 2016-06-02 NOTE — Progress Notes (Signed)
   06/02/16 0700  Clinical Encounter Type  Visited With Patient;Health care provider  Visit Type Initial;Psychological support;Spiritual support;Social support  Referral From Nurse  Consult/Referral To Chaplain  Recommendations Check in with the patient, see how she is doing, let her know about the services we offer.  Spiritual Encounters  Spiritual Needs Prayer;Emotional;Grief support  Stress Factors  Patient Stress Factors Health changes  Family Stress Factors None identified  Advance Directives (For Healthcare)  Does Patient Have a Medical Advance Directive? No  Would patient like information on creating a medical advance directive? Yes (Inpatient - patient defers creating a medical advance directive at this time)  Mental Health Advance Directives  Does Patient Have a Mental Health Advance Directive? No  Would patient like information on creating a mental health advance directive? Yes (MAU/Ambulatory/Procedural Areas - Information given)     Patient was in a good mood and feeling better this morning. Her disposition was warm and inviting and although she did not want anything she was willing to engage in conversation with the Chaplain and communicated that she understood all the services offered.

## 2016-06-03 LAB — HEMOGLOBIN A1C
HEMOGLOBIN A1C: 5 % (ref 4.8–5.6)
Mean Plasma Glucose: 97 mg/dL

## 2016-06-03 MED ORDER — HYDROXYZINE HCL 50 MG PO TABS
50.0000 mg | ORAL_TABLET | Freq: Four times a day (QID) | ORAL | Status: DC | PRN
  Administered 2016-06-03 – 2016-06-04 (×3): 50 mg via ORAL
  Filled 2016-06-03 (×3): qty 1

## 2016-06-03 MED ORDER — NICOTINE 21 MG/24HR TD PT24
21.0000 mg | MEDICATED_PATCH | Freq: Every day | TRANSDERMAL | Status: DC
  Administered 2016-06-03 – 2016-06-04 (×2): 21 mg via TRANSDERMAL
  Filled 2016-06-03 (×3): qty 1

## 2016-06-03 NOTE — Progress Notes (Signed)
D: Pt denies SI/HI/AVH. Pt is pleasant and cooperative, affect is flat ut she brightens upon approach. . Pt appears less anxious and he is interacting with peers and staff appropriately.  A: Pt was offered support and encouragement. Pt was given scheduled medications. Pt was encouraged to attend groups. Q 15 minute checks were done for safety.  R:Pt attends groups and interacts well with peers and staff. Pt is taking medication. Pt has no complaints.Pt receptive to treatment and safety maintained on unit.

## 2016-06-03 NOTE — Progress Notes (Signed)
Patient with sad affect, slightly brighter, cooperative behavior with meals, meds and plan of care. No SI/HI/SH at this time. Patient states she is "going through a separation with her husband and that no body is worth the self harm she did to herself". Patient with self inflicted cuts to right and left wrists. Areas clean and dry with no redness, swelling or drainage. Patient remains on Cleocin. Educated to monitor for stressors and to learn and initiate coping skills. Patient to attend therapy groups. Quiet with peers. Safety maintained.

## 2016-06-03 NOTE — [facility] Group Notes (Signed)
[facility] Group Notes:  (Nursing/MHT/Case Management/Adjunct)  Date:  06/03/2016  Time:  12:53 AM  Type of Therapy:  Group Therapy  Participation Level:  Did Not Attend    Veva Holesshley Imani Keishla Oyer 06/03/2016, 12:53 AM

## 2016-06-03 NOTE — [facility] Group Notes (Signed)
[facility] LCSW Group Therapy  ? 06/03/2016 1pm  ? Type of Therapy: Group Therapy  ? Participation Level: Active  ? Participation Quality: Attentive, Sharing and Supportive  ? Affect: Appropriate Cognitive: Alert and Oriented  ? Insight: Developing/Improving and Engaged  ? Engagement in Therapy: Developing/Improving and Engaged  ? Modes of Intervention: Clarification, Confrontation, Discussion, Education, Exploration, Limit-setting, Orientation, Problem-solving, Rapport Building, Dance movement psychotherapisteality Testing, Socialization and Support  ? Summary of Progress/Problems: The topic for group was balance in life. Today's group focused on defining balance in one's own words, identifying things that can knock one off balance, and exploring healthy ways to maintain balance in life. Group members were asked to provide an example of a time when they felt off balance, describe how they handled that situation, and process healthier ways to regain balance in the future. Group members were asked to share the most important tool for maintaining balance that [facility] and how they plan to apply this method after discharge. Pt reports that listening to music helps to bring balance to her life when she isn't feeling like herself. Pt left group shortly after sharing and did not return.  Jonathon JordanLynn B Shawnell Dykes, MSW, LCSWA 06/03/16, 3:45 PM

## 2016-06-03 NOTE — Progress Notes (Signed)
[facility] MD Progress Note  06/03/2016 8:10 PM Destiny Myers  MRN:  [id number] Subjective:  Follow-up for 40 year old woman with depression and substance abuse. Reports today that her mood is feeling better. Not having active suicidal ideation. Still sleeping poorly and feeling nervous. Still having some pain in her arm but no other new medical problems. No evidence of psychotic thinking. Tolerating medication well and attending groups appropriately Principal Problem: Severe recurrent major depression without psychotic features ([place]) Diagnosis:   Patient Active Problem List   Diagnosis Date Noted  . Cocaine abuse [F14.10] 06/02/2016  . Self-inflicted laceration of wrist [S61.519A] 06/02/2016  . Tobacco use disorder [F17.200] 06/17/2015  . PTSD (post-traumatic stress disorder) [F43.10] 06/17/2015  . Severe recurrent major depression without psychotic features ([place]) [F33.2] 06/16/2015  . Opioid use disorder, mild, abuse [F11.10] 06/16/2015  . Alcohol use disorder, mild, abuse [F10.10] 06/16/2015  . Suicide attempt [T14.91XA] 06/16/2015  . Hypothyroidism [E03.9] 06/16/2015   Total Time spent with patient: 30 minutes  Past Psychiatric History: Past history of drug abuse and recurrent depression  Past Medical History:  Past Medical History:  Diagnosis Date  . Anxiety   . Depression   . Hypothyroidism   . PTSD (post-traumatic stress disorder)     Past Surgical History:  Procedure Laterality Date  . CESAREAN SECTION    . SHOULDER SURGERY Right    Family History: History reviewed. No pertinent family history. Family Psychiatric  History: None identified Social History:  History  Alcohol Use  . Yes     History  Drug Use  . Types: Marijuana    Comment: last smoked a few weeks ago    Social History   Social History  . Marital status: Legally Separated    Spouse name: N/A  . Number of children: N/A  . Years of education: N/A   Social History Main Topics  . Smoking status:  Current Every Day Smoker    Packs/day: 0.50    Types: Cigarettes  . Smokeless tobacco: Never Used  . Alcohol use Yes  . Drug use:     Types: Marijuana     Comment: last smoked a few weeks ago  . Sexual activity: Not Asked   Other Topics Concern  . None   Social History Narrative  . None   Additional Social History:                         Sleep: Poor  Appetite:  Fair  Current Medications: Current Facility-Administered Medications  Medication Dose Route Frequency Provider Last Rate Last Dose  . acetaminophen (TYLENOL) tablet 650 mg  650 mg Oral Q6H PRN Gonzella Lex, MD      . alum & mag hydroxide-simeth (MAALOX/MYLANTA) 200-200-20 MG/5ML suspension 30 mL  30 mL Oral Q4H PRN Gonzella Lex, MD      . cephALEXin (KEFLEX) capsule 500 mg  500 mg Oral Q8H Gonzella Lex, MD   500 mg at 06/03/16 1310  . hydrOXYzine (ATARAX/VISTARIL) tablet 50 mg  50 mg Oral Q6H PRN Gonzella Lex, MD   50 mg at 06/03/16 1344  . magnesium hydroxide (MILK OF MAGNESIA) suspension 30 mL  30 mL Oral Daily PRN Gonzella Lex, MD      . nicotine (NICODERM CQ - dosed in mg/24 hours) patch 21 mg  21 mg Transdermal Daily Gonzella Lex, MD   21 mg at 06/03/16 4259  . sertraline (ZOLOFT) tablet 150 mg  150 mg Oral Daily Gonzella Lex, MD   150 mg at 06/03/16 2330  . traZODone (DESYREL) tablet 100 mg  100 mg Oral QHS PRN Gonzella Lex, MD        Lab Results:  Results for orders placed or performed during the hospital encounter of 06/01/16 (from the past 48 hour(s))  Hemoglobin A1c     Status: None   Collection Time: 06/02/16  6:40 AM  Result Value Ref Range   Hgb A1c MFr Bld 5.0 4.8 - 5.6 %    Comment: (NOTE)         Pre-diabetes: 5.7 - 6.4         Diabetes: >6.4         Glycemic control for adults with diabetes: <7.0    Mean Plasma Glucose 97 mg/dL    Comment: (NOTE) Performed At: [facility] [place], [place] [id number] Lindon Romp MD LK:[id number]   Lipid  panel     Status: Abnormal   Collection Time: 06/02/16  6:40 AM  Result Value Ref Range   Cholesterol 178 0 - 200 mg/dL   Triglycerides 48 <150 mg/dL   HDL 66 >40 mg/dL   Total CHOL/HDL Ratio 2.7 RATIO   VLDL 10 0 - 40 mg/dL   LDL Cholesterol 102 (H) 0 - 99 mg/dL    Comment:        Total Cholesterol/HDL:CHD Risk Coronary Heart Disease Risk Table                     Men   Women  1/2 Average Risk   3.4   3.3  Average Risk       5.0   4.4  2 X Average Risk   9.6   7.1  3 X Average Risk  23.4   11.0        Use the calculated Patient Ratio above and the CHD Risk Table to determine the patient's CHD Risk.        ATP III CLASSIFICATION (LDL):  <100     mg/dL   Optimal  100-129  mg/dL   Near or Above                    Optimal  130-159  mg/dL   Borderline  160-189  mg/dL   High  >190     mg/dL   Very High   TSH     Status: None   Collection Time: 06/02/16  6:40 AM  Result Value Ref Range   TSH 3.385 0.350 - 4.500 uIU/mL    Comment: Performed by a 3rd Generation assay with a functional sensitivity of <=0.01 uIU/mL.    Blood Alcohol level:  Lab Results  Component Value Date   ETH <5 05/31/2016   ETH 5 (H) 34/28/7681    Metabolic Disorder Labs: Lab Results  Component Value Date   HGBA1C 5.0 06/02/2016   MPG 97 06/02/2016   No results found for: PROLACTIN Lab Results  Component Value Date   CHOL 178 06/02/2016   TRIG 48 06/02/2016   HDL 66 06/02/2016   CHOLHDL 2.7 06/02/2016   VLDL 10 06/02/2016   LDLCALC 102 (H) 06/02/2016   LDLCALC 88 06/16/2015    Physical Findings: AIMS: Facial and Oral Movements Muscles of Facial Expression: None, normal Lips and Perioral Area: None, normal Jaw: None, normal Tongue: None, normal,Extremity Movements Upper (arms, wrists, hands, fingers): None, normal Lower (legs, knees, ankles,  toes): None, normal, Trunk Movements Neck, shoulders, hips: None, normal, Overall Severity Severity of abnormal movements (highest score from  questions above): None, normal Incapacitation due to abnormal movements: None, normal Patient's awareness of abnormal movements (rate only patient's report): No Awareness, Dental Status Current problems with teeth and/or dentures?: No Does patient usually wear dentures?: No  CIWA:    COWS:     Musculoskeletal: Strength & Muscle Tone: within normal limits Gait & Station: normal Patient leans: N/A  Psychiatric Specialty Exam: Physical Exam  Nursing note and vitals reviewed. Constitutional: She appears well-developed and well-nourished.  HENT:  Head: Normocephalic and atraumatic.  Eyes: Conjunctivae are normal. Pupils are equal, round, and reactive to light.  Neck: Normal range of motion.  Cardiovascular: Regular rhythm and normal heart sounds.   Respiratory: Effort normal. No respiratory distress.  GI: Soft.  Musculoskeletal: Normal range of motion.  Neurological: She is alert.  Skin: Skin is warm and dry.     Psychiatric: Her speech is normal and behavior is normal. Judgment normal. Cognition and memory are normal. She exhibits a depressed mood. She expresses no suicidal ideation.    Review of Systems  Constitutional: Negative.   HENT: Negative.   Eyes: Negative.   Respiratory: Negative.   Cardiovascular: Negative.   Gastrointestinal: Negative.   Musculoskeletal: Negative.   Skin: Negative.   Neurological: Negative.   Psychiatric/Behavioral: Positive for depression and substance abuse. Negative for hallucinations and suicidal ideas. The patient is nervous/anxious and has insomnia.     Blood pressure (!) 144/82, pulse 71, temperature 97.8 F (36.6 C), resp. rate 18, height '5\' 4"'  (1.626 m), weight 87.5 kg (193 lb).Body mass index is 33.13 kg/m.  General Appearance: Casual  Eye Contact:  Good  Speech:  Normal Rate  Volume:  Normal  Mood:  Dysphoric  Affect:  Appropriate  Thought Process:  Goal Directed  Orientation:  Full (Time, Place, and Person)  Thought Content:   Logical  Suicidal Thoughts:  No  Homicidal Thoughts:  No  Memory:  Immediate;   Good Recent;   Fair Remote;   Good  Judgement:  Fair  Insight:  Good  Psychomotor Activity:  Normal  Concentration:  Concentration: Fair  Recall:  [employer] of Knowledge:  Fair  Language:  Good  Akathisia:  No  Handed:  Right  AIMS (if indicated):     Assets:  Communication Skills Desire for Improvement Physical Health Resilience Social Support  ADL's:  Intact  Cognition:  WNL  Sleep:  Number of Hours: 6.75     Treatment Plan Summary: Daily contact with patient to assess and evaluate symptoms and progress in treatment, Medication management and Plan Patient is looking like she's feeling much better. She says that she still slept poorly last night but admits that she did not take the when necessary trazodone. She says she will plan to do that tonight. She is working on coming up with a positive plan for a place to live when she leaves the hospital. Anticipate no other changes right now in medication. Length of stay probably in the range of 1-2 days. Treatment team met with the patient and she agrees to the plan.  Alethia Berthold, MD 06/03/2016, 8:10 PM

## 2016-06-03 NOTE — Progress Notes (Signed)
D: Pt denies SI/HI/AVH. Pt is pleasant and cooperative, affect is flat and sad, appears withdrawn, no interacting with peers and staff.  A: Pt was offered support and encouragement. Pt was given scheduled medications. Pt was encouraged to attend groups. Q 15 minute checks were done for safety.  R:Pt did not attend group. Pt is taking medication. Pt has no complaints.Pt receptive to treatment and safety maintained on unit.

## 2016-06-03 NOTE — [facility] Suicide Risk Assessment (Signed)
[facility] INPATIENT:  Family/Significant Other Suicide Prevention Education  Suicide Prevention Education:  Education Completed; friend, Destiny Myers ph#: [phone] has been identified by the patient as the family member/significant other with whom the patient will be residing, and identified as the person(s) who will aid the patient in the event of a mental health crisis (suicidal ideations/suicide attempt).  With written consent from the patient, the family member/significant other has been provided the following suicide prevention education, prior to the and/or following the discharge of the patient.  The suicide prevention education provided includes the following:  Suicide risk factors  Suicide prevention and interventions  National Suicide Hotline telephone number  [facility] assessment telephone number  [facility] Emergency Assistance 911  [facility] and/or Residential Mobile Crisis Unit telephone number  Request made of family/significant other to:  Remove weapons (e.g., guns, rifles, knives), all items previously/currently identified as safety concern.    Remove drugs/medications (over-the-counter, prescriptions, illicit drugs), all items previously/currently identified as a safety concern.  The family member/significant other verbalizes understanding of the suicide prevention education information provided.  The family member/significant other agrees to remove the items of safety concern listed above.  Destiny OxfordKadijah R Eva Myers, MSW, LCSW-A 06/03/2016, 10:16 AM

## 2016-06-04 MED ORDER — SERTRALINE HCL 50 MG PO TABS: 150.0000 mg | ORAL_TABLET | Freq: Every day | ORAL | 1 refills | Status: DC

## 2016-06-04 MED ORDER — TRAZODONE HCL 100 MG PO TABS: 100.0000 mg | ORAL_TABLET | Freq: Every evening | ORAL | 1 refills | Status: DC | PRN

## 2016-06-04 MED ORDER — HYDROXYZINE HCL 50 MG PO TABS: 50.0000 mg | ORAL_TABLET | Freq: Four times a day (QID) | ORAL | 1 refills | Status: DC | PRN

## 2016-06-04 MED ORDER — IBUPROFEN 800 MG PO TABS: 800.0000 mg | ORAL_TABLET | Freq: Four times a day (QID) | ORAL | Status: DC | PRN

## 2016-06-04 NOTE — Progress Notes (Signed)
  [facility] :  Will you be returning to the same living situation after discharge:  Yes,  home with friend. At discharge, do you have transportation home?: Yes,  cab voucher issued. Do you have the ability to pay for your medications: No.  Release of information consent forms completed and in the chart;  Patient's signature needed at discharge.  Patient to Follow up at: Follow-up Information    [facility]. Go on 06/17/2016.   Why:  Please make sure your follow-up with your therapy and medication [facility]. Your appt. is scheduled at 1PM. If you have any questions or concerns, contact your doctor. Contact information: Address: [address] [place], [place] [address] Phone: [phone] Fax: [phone]       [facility]. Go on 06/08/2016.   Why:  Please use [facility] walk-in clinic in case of an emergency or crises. Please contact [facility] or walk in M-F between the hours of 8:30AM-3PM. Contact information: Address: [address] [place], [place] [address] Phone: [phone]          Next level of care provider has access to [facility] Link:no  Safety Planning and Suicide Prevention discussed: Yes,  SPE completed with patient and friend, Loraine LericheMark.  Have you used any form of tobacco in the last 30 days? (Cigarettes, Smokeless Tobacco, Cigars, and/or Pipes): Yes  Has patient been referred to the Quitline?: Patient refused referral  Patient has been referred for addiction treatment: Pt. refused referral  Lynden OxfordKadijah R Quinton Voth, MSW, LCSW-A 06/04/2016, 3:54 PM

## 2016-06-04 NOTE — Progress Notes (Signed)
Patient stayed in bed, slept all night. No sign of discomfort. Safety precautions maintained.

## 2016-06-04 NOTE — Progress Notes (Signed)
Denies SI/HI/AVH.  Verbalizes that she has some anxiety.  Further states that what she did is stupid.   Discharge instructions given.  Verbalized understanding.  Prescriptions and seven day supply of medications given.  Personal belongings returned.  Escorted off unit by this Clinical research associatewriter to main entrance to meet taxi to travel home.

## 2016-06-04 NOTE — [facility] Group Notes (Signed)
[facility] Group Notes:  (Nursing/MHT/Case Management/Adjunct)  Date:  06/04/2016  Time:  1:08 AM  Type of Therapy:  Group Therapy  Participation Level:  Active  Participation Quality:  Appropriate  Affect:  Appropriate  Cognitive:  Appropriate  Insight:  Appropriate  Engagement in Group:  Engaged  Modes of Intervention:  n/a  Summary of Progress/Problems:  Veva Holesshley Imani Cera Rorke 06/04/2016, 1:08 AM

## 2016-06-04 NOTE — Discharge Summary (Addendum)
Physician Discharge Summary Note  Patient:  Shelbie AmmonsJennifer K Diebel is an 40 y.o., female MRN:  [id number] DOB:  [birth year] Patient phone:  [phone] (home)  Patient address:   [address] [place] [address],  Total Time spent with patient: 30 minutes  Date of Admission:  06/01/2016 Date of Discharge: 06/04/2016  Reason for Admission:  Suicide attempt.  History of Present Illness: 40 year old woman with a history of past depression as well as a history of substance abuse presented to the emergency room after a suicide attempt. She had lacerated her left wrist and overdosed on a large amount of Vistaril. Patient reports depression has been getting worse for the past few worry. Her husband threw her out of the house and has not been allowing her to see the children. Patient has been sleeping poorly. Eating poorly. She says that she has been compliant with her Zoloft but had been taken off clonazepam by her primary care doctor. She denied to me that she was abusing other drugs but her drug screen is positive for cocaine. Receiving any outpatient provider. She denied having any psychotic symptoms. Denied homicidal ideation. Associated Signs/Symptoms: Depression Symptoms:  depressed mood, anhedonia, insomnia, psychomotor retardation, hopelessness, suicidal thoughts with specific plan, (Hypo) Manic Symptoms:  None reported Anxiety Symptoms:  Excessive Worry, Psychotic Symptoms:  None reported PTSD Symptoms: Had a traumatic exposure:  History of trauma in the past  Past Psychiatric History: Patient has a history of prior psychiatric hospitalization here in January of this year. History of abuse of multiple drugs. She told me she had not been recently using any drugs but her screen is positive for cocaine. She never tried to kill her self in the past but had been self injuring when she was in middle school. Denies any past history of violence.  Family Psychiatric  History: Denies  any.  Social history. The patient has been married for 15 years and has 38 year old twins. Her husband demanded separation. She will in with her friend, Loraine LericheMark, who unfortunately is a [employer] owner. He agrees to lock the guns away but not to remove them from the house entirely. She intends to go to school.  Principal Problem: Severe recurrent major depression without psychotic features [facility](HCC) Discharge Diagnoses: Patient Active Problem List   Diagnosis Date Noted  . Cocaine use disorder, moderate, dependence (HCC) [F14.20] 06/02/2016  . Self-inflicted laceration of wrist [S61.519A] 06/02/2016  . Tobacco use disorder [F17.200] 06/17/2015  . PTSD (post-traumatic stress disorder) [F43.10] 06/17/2015  . Severe recurrent major depression without psychotic features (HCC) [F33.2] 06/16/2015  . Opioid use disorder, mild, abuse [F11.10] 06/16/2015  . Alcohol use disorder, mild, abuse [F10.10] 06/16/2015  . Suicide attempt [T14.91XA] 06/16/2015  . Hypothyroidism [E03.9] 06/16/2015    Past Medical History:  Past Medical History:  Diagnosis Date  . Anxiety   . Depression   . Hypothyroidism   . PTSD (post-traumatic stress disorder)     Past Surgical History:  Procedure Laterality Date  . CESAREAN SECTION    . SHOULDER SURGERY Right    Family History: History reviewed. No pertinent family history.  Social History:  History  Alcohol Use  . Yes     History  Drug Use  . Types: Marijuana    Comment: last smoked a few weeks ago    Social History   Social History  . Marital status: Legally Separated    Spouse name: N/A  . Number of children: N/A  . Years of education: N/A  Social History Main Topics  . Smoking status: Current Every Day Smoker    Packs/day: 0.50    Types: Cigarettes  . Smokeless tobacco: Never Used  . Alcohol use Yes  . Drug use:     Types: Marijuana     Comment: last smoked a few weeks ago  . Sexual activity: Not Asked   Other Topics Concern  . None   Social  History Narrative  . None    Hospital Course:    Ms. Jeanbaptiste Is a 40 year old female with a history of depression admitted after suicide attempt by cutting and Vistaril overdose in the context of dissolution of her marriage.  1. Suicidal ideation. Resolved. The patient is able to contract for safety in the community. She is forward thinking and optimistic about the future.  2. Mood. We increase the dose of Zoloft to 150 mg for depression and anxiety. Vistaril was also available.  3. Insomnia. Trazodone was available.  4. Smoking. Nicotine patch was available.  5. Superficial skin cuts. She was offered Keflex but refused.  6. Substance abuse. The patient minimizes her problems and declines treatment.   7. Disposition. She was discharged with her friend, Loraine Leriche. She will follow up with a therapist at [facility] and a psychiatrist at [facility]  Physical Findings: AIMS: Facial and Oral Movements Muscles of Facial Expression: None, normal Lips and Perioral Area: None, normal Jaw: None, normal Tongue: None, normal,Extremity Movements Upper (arms, wrists, hands, fingers): None, normal Lower (legs, knees, ankles, toes): None, normal, Trunk Movements Neck, shoulders, hips: None, normal, Overall Severity Severity of abnormal movements (highest score from questions above): None, normal Incapacitation due to abnormal movements: None, normal Patient's awareness of abnormal movements (rate only patient's report): No Awareness, Dental Status Current problems with teeth and/or dentures?: No Does patient usually wear dentures?: No  CIWA:    COWS:     Musculoskeletal: Strength & Muscle Tone: within normal limits Gait & Station: normal Patient leans: N/A  Psychiatric Specialty Exam: Physical Exam  Nursing note and vitals reviewed.   Review of Systems  Psychiatric/Behavioral: Positive for depression and substance abuse.  All other systems reviewed and are negative.   Blood pressure (!) 141/82,  pulse 72, temperature 98.7 F (37.1 C), temperature source Oral, resp. rate 18, height 5\' 4"  (1.626 m), weight 87.5 kg (193 lb).Body mass index is 33.13 kg/m.  General Appearance: Casual  Eye Contact:  Good  Speech:  Clear and Coherent  Volume:  Normal  Mood:  Euthymic  Affect:  Appropriate  Thought Process:  Goal Directed and Descriptions of Associations: Intact  Orientation:  Full (Time, Place, and Person)  Thought Content:  WDL  Suicidal Thoughts:  No  Homicidal Thoughts:  No  Memory:  Immediate;   Fair Recent;   Fair Remote;   Fair  Judgement:  Impaired  Insight:  Present  Psychomotor Activity:  Normal  Concentration:  Concentration: Fair and Attention Span: Fair  Recall:  [employer] of Knowledge:  Fair  Language:  Fair  Akathisia:  No  Handed:  Right  AIMS (if indicated):     Assets:  Communication Skills Desire for Improvement Housing Physical Health Resilience Social Support  ADL's:  Intact  Cognition:  WNL  Sleep:  Number of Hours: 6.45     Have you used any form of tobacco in the last 30 days? (Cigarettes, Smokeless Tobacco, Cigars, and/or Pipes): Yes  Has this patient used any form of tobacco in the last 30 days? (Cigarettes,  Smokeless Tobacco, Cigars, and/or Pipes) Yes, Yes, A prescription for an FDA-approved tobacco cessation medication was offered at discharge and the patient refused  Blood Alcohol level:  Lab Results  Component Value Date   ETH <5 05/31/2016   ETH 5 (H) 06/16/2015    Metabolic Disorder Labs:  Lab Results  Component Value Date   HGBA1C 5.0 06/02/2016   MPG 97 06/02/2016   No results found for: PROLACTIN Lab Results  Component Value Date   CHOL 178 06/02/2016   TRIG 48 06/02/2016   HDL 66 06/02/2016   CHOLHDL 2.7 06/02/2016   VLDL 10 06/02/2016   LDLCALC 102 (H) 06/02/2016   LDLCALC 88 06/16/2015    See Psychiatric Specialty Exam and Suicide Risk Assessment completed by Attending Physician prior to discharge.  Discharge  destination:  Home  Is patient on multiple antipsychotic therapies at discharge:  No   Has Patient had three or more failed trials of antipsychotic monotherapy by history:  No  Recommended Plan for Multiple Antipsychotic Therapies: NA  Discharge Instructions    Diet - low sodium heart healthy    Complete by:  As directed    Increase activity slowly    Complete by:  As directed      Allergies as of 06/04/2016      Reactions   Sulfa Antibiotics Nausea And Vomiting   Tylenol [acetaminophen] Nausea And Vomiting      Medication List    STOP taking these medications   buPROPion 150 MG 24 hr tablet Commonly known as:  WELLBUTRIN XL   clonazePAM 1 MG tablet Commonly known as:  KLONOPIN   ondansetron 4 MG tablet Commonly known as:  ZOFRAN   oxyCODONE 5 MG immediate release tablet Commonly known as:  Oxy IR/ROXICODONE   prazosin 2 MG capsule Commonly known as:  MINIPRESS   QUEtiapine 100 MG tablet Commonly known as:  SEROQUEL     TAKE these medications     Indication  clindamycin 300 MG capsule Commonly known as:  CLEOCIN Take 1 capsule (300 mg total) by mouth 3 (three) times daily.  Indication:  Infection in Pelvic Region of Body   cloNIDine 0.1 MG tablet Commonly known as:  CATAPRES Take 1 tablet (0.1 mg total) by mouth 2 (two) times daily. What changed:  Another medication with the same name was removed. Continue taking this medication, and follow the directions you see here.  Indication:  Conduct Disorder   hydrOXYzine 50 MG tablet Commonly known as:  ATARAX/VISTARIL Take 1 tablet (50 mg total) by mouth every 6 (six) hours as needed (for anxiety).  Indication:  Anxiety Neurosis   levothyroxine 88 MCG tablet Commonly known as:  SYNTHROID, LEVOTHROID Take 1 tablet (88 mcg total) by mouth daily before breakfast.  Indication:  Underactive Thyroid   metroNIDAZOLE 250 MG tablet Commonly known as:  FLAGYL Take 250 mg by mouth 3 (three) times daily.  Indication:   Pelvic Inflammatory Disease   sertraline 50 MG tablet Commonly known as:  ZOLOFT Take 3 tablets (150 mg total) by mouth daily. Start taking on:  06/05/2016 What changed:  medication strength  how much to take  Indication:  Major Depressive Disorder   traZODone 100 MG tablet Commonly known as:  DESYREL Take 1 tablet (100 mg total) by mouth at bedtime as needed for sleep.  Indication:  Trouble Sleeping      As tolerated.ollow-up recommendations:  Activity:  As tolerated. Diet:  Low sodium heart healthy. Other:  Keep follow-up  appointments.  Comments:    Signed: Kristine LineaJolanta Zavior Thomason, MD 06/04/2016, 3:46 PM

## 2016-06-04 NOTE — Plan of Care (Signed)
Problem: Coping: Goal: Ability to cope will improve Outcome: Progressing Patient able to discuss her thoughts and concern

## 2016-06-04 NOTE — Progress Notes (Signed)
Patient stayed in the room but was out to express her needs. Continues to express haplessness but denies SI/HI. Had her bed time medications and currently sleeping Safety maintained.

## 2016-06-04 NOTE — [facility] Suicide Risk Assessment (Signed)
[facility]   Principal Problem: Severe recurrent major depression without psychotic features [facility](HCC) Discharge Diagnoses:  Patient Active Problem List   Diagnosis Date Noted  . Cocaine use disorder, moderate, dependence (HCC) [F14.20] 06/02/2016  . Self-inflicted laceration of wrist [S61.519A] 06/02/2016  . Tobacco use disorder [F17.200] 06/17/2015  . PTSD (post-traumatic stress disorder) [F43.10] 06/17/2015  . Severe recurrent major depression without psychotic features (HCC) [F33.2] 06/16/2015  . Opioid use disorder, mild, abuse [F11.10] 06/16/2015  . Alcohol use disorder, mild, abuse [F10.10] 06/16/2015  . Suicide attempt [T14.91XA] 06/16/2015  . Hypothyroidism [E03.9] 06/16/2015    Total Time spent with patient: 20 minutes  Musculoskeletal: Strength & Muscle Tone: within normal limits Gait & Station: normal Patient leans: N/A  Psychiatric Specialty Exam: Review of Systems  Psychiatric/Behavioral: Positive for depression and substance abuse.  All other systems reviewed and are negative.   Blood pressure (!) 141/82, pulse 72, temperature 98.7 F (37.1 C), temperature source Oral, resp. rate 18, height 5\' 4"  (1.626 m), weight 87.5 kg (193 lb).Body mass index is 33.13 kg/m.  General Appearance: Casual  Eye Contact::  Good  Speech:  Clear and Coherent409  Volume:  Normal  Mood:  Euthymic  Affect:  Appropriate  Thought Process:  Goal Directed and Descriptions of Associations: Intact  Orientation:  Full (Time, Place, and Person)  Thought Content:  WDL  Suicidal Thoughts:  No  Homicidal Thoughts:  No  Memory:  Immediate;   Fair Recent;   Fair Remote;   Fair  Judgement:  Impaired  Insight:  Present  Psychomotor Activity:  Normal  Concentration:  Fair  Recall:  [employer]  Fund of Knowledge:Fair  Language: Fair  Akathisia:  No  Handed:  Right  AIMS (if indicated):     Assets:  Communication Skills Desire for Improvement Financial  Resources/Insurance Housing Physical Health Resilience Social Support  Sleep:  Number of Hours: 6.45  Cognition: WNL  ADL's:  Intact   Mental Status Per Nursing Assessment::   On Admission:  Suicidal ideation indicated by patient, Suicidal ideation indicated by others, Self-harm behaviors, Belief that plan would result in death  Demographic Factors:  Caucasian and Unemployed  Loss Factors: Loss of significant relationship and Financial problems/change in socioeconomic status  Historical Factors: Family history of mental illness or substance abuse and Impulsivity  Risk Reduction Factors:   Responsible for children under 38 years of age, Sense of responsibility to family, Living with another person, especially a relative, Positive social support and Positive therapeutic relationship  Continued Clinical Symptoms:  Depression:   Comorbid alcohol abuse/dependence Impulsivity Alcohol/Substance Abuse/Dependencies  Cognitive Features That Contribute To Risk:  None    Suicide Risk:  Minimal: No identifiable suicidal ideation.  Patients presenting with no risk factors but with morbid ruminations; may be classified as minimal risk based on the severity of the depressive symptoms    Plan Of Care/Follow-up recommendations:  Activity:  As tolerated. Diet:  Low sodium heart healthy. Other:  Keep follow-up appointments.  Kristine LineaJolanta Miraj Truss, MD 06/04/2016, 3:43 PM

## 2016-06-04 NOTE — [facility] Group Notes (Signed)
[facility] Group Notes:  (Nursing/MHT/Case Management/Adjunct)  Date:  06/04/2016  Time:  4:07 PM  Type of Therapy:  Psychoeducational Skills  Participation Level:  Active  Participation Quality:  Appropriate  Affect:  Appropriate  Cognitive:  Alert and Appropriate  Insight:  Appropriate  Engagement in Group:  Developing/Improving and Engaged  Modes of Intervention:  Activity, Discussion and Education  Summary of Progress/Problems:  Destiny Myers 06/04/2016, 4:07 PM

## 2016-06-04 NOTE — [facility] Group Notes (Signed)
[facility] LCSW Group Therapy  06/04/2016 2:41 PM  Type of Therapy:  Group Therapy  Participation Level:  Active  Participation Quality:  Attentive  Affect:  Appropriate  Cognitive:  Appropriate  Insight:  Engaged  Engagement in Therapy:  Developing/Improving  Modes of Intervention:  Activity, Clarification, Discussion, Education, Exploration and Socialization  Summary of Progress/Problems: Summary of Progress/Problems: The topic for today was feelings about relapse. Pt discussed what relapse prevention is to them and identified triggers that they are on the path to relapse. Pt processed their feeling towards relapse and was able to relate to peers. Pt discussed coping skills that can be used for relapse prevention. This patient used her humour and supported peers. Patient has good insight and followed easily the topics of her peers.   Arrie SenateBandi, Robertta Halfhill M 06/04/2016, 2:41 PM

## 2016-06-04 NOTE — Plan of Care (Signed)
Problem: Medication: Goal: Compliance with prescribed medication regimen will improve Outcome: Progressing Taking medications as prescribed   

## 2016-06-17 ENCOUNTER — Encounter: Payer: Self-pay | Admitting: Emergency Medicine

## 2016-06-17 ENCOUNTER — Emergency Department
Admission: EM | Admit: 2016-06-17 | Discharge: 2016-06-17 | Disposition: A | Discharge status: Home / Self Care | Payer: Self-pay | Attending: Emergency Medicine | Admitting: Emergency Medicine

## 2016-06-17 DIAGNOSIS — F17210 Nicotine dependence, cigarettes, uncomplicated: Secondary | ICD-10-CM | POA: Insufficient documentation

## 2016-06-17 DIAGNOSIS — Z4802 Encounter for removal of sutures: Secondary | ICD-10-CM | POA: Insufficient documentation

## 2016-06-17 DIAGNOSIS — E039 Hypothyroidism, unspecified: Secondary | ICD-10-CM | POA: Insufficient documentation

## 2016-06-17 DIAGNOSIS — Z79899 Other long term (current) drug therapy: Secondary | ICD-10-CM | POA: Insufficient documentation

## 2016-06-17 NOTE — ED Provider Notes (Signed)
[facility] Emergency Department Provider Note  ____________________________________________  Time seen: Approximately 5:00 PM  I have reviewed the triage vital signs and the nursing notes.   HISTORY  Chief Complaint Suture / Staple Removal    HPI Destiny Myers is a 41 y.o. female presenting to the emergency department for suture removal from the volar aspect of her left forearm. Patient states that sutures have been in place since 05/31/2016. Patient has noticed no streaking or purulent exudate being expressed from laceration site. Patient denies fever.   Past Medical History:  Diagnosis Date  . Anxiety   . Depression   . Hypothyroidism   . PTSD (post-traumatic stress disorder)     Patient Active Problem List   Diagnosis Date Noted  . Cocaine use disorder, moderate, dependence (HCC) 06/02/2016  . Self-inflicted laceration of wrist 06/02/2016  . Tobacco use disorder 06/17/2015  . PTSD (post-traumatic stress disorder) 06/17/2015  . Severe recurrent major depression without psychotic features (HCC) 06/16/2015  . Opioid use disorder, mild, abuse 06/16/2015  . Alcohol use disorder, mild, abuse 06/16/2015  . Suicide attempt 06/16/2015  . Hypothyroidism 06/16/2015    Past Surgical History:  Procedure Laterality Date  . CESAREAN SECTION    . SHOULDER SURGERY Right     Prior to Admission medications   Medication Sig Start Date End Date Taking? Authorizing Provider  cloNIDine (CATAPRES) 0.1 MG tablet Take 1 tablet (0.1 mg total) by mouth 2 (two) times daily. 06/18/15   Shari ProwsJolanta B Pucilowska, MD  hydrOXYzine (ATARAX/VISTARIL) 50 MG tablet Take 1 tablet (50 mg total) by mouth every 6 (six) hours as needed (for anxiety). 06/04/16   Shari ProwsJolanta B Pucilowska, MD  levothyroxine (SYNTHROID, LEVOTHROID) 88 MCG tablet Take 1 tablet (88 mcg total) by mouth daily before breakfast. 06/18/15   Jolanta B Pucilowska, MD  metroNIDAZOLE (FLAGYL) 250 MG tablet Take 250 mg  by mouth 3 (three) times daily.    Historical Provider, MD  sertraline (ZOLOFT) 50 MG tablet Take 3 tablets (150 mg total) by mouth daily. 06/05/16   Shari ProwsJolanta B Pucilowska, MD  traZODone (DESYREL) 100 MG tablet Take 1 tablet (100 mg total) by mouth at bedtime as needed for sleep. 06/04/16   Shari ProwsJolanta B Pucilowska, MD    Allergies Sulfa antibiotics and Tylenol [acetaminophen]  No family history on file.  Social History Social History  Substance Use Topics  . Smoking status: Current Every Day Smoker    Packs/day: 0.50    Types: Cigarettes  . Smokeless tobacco: Never Used  . Alcohol use Yes     Review of Systems  Constitutional: No fever/chills Cardiovascular: no chest pain. Respiratory: no cough. No SOB. Skin: Patient has repaired laceration.  Neurological: Negative for headaches, focal weakness or numbness. 10-point ROS otherwise negative.  ____________________________________________   PHYSICAL EXAM:  VITAL SIGNS: ED Triage Vitals  Enc Vitals Group     BP --      Pulse Rate 06/17/16 1655 78     Resp 06/17/16 1655 20     Temp 06/17/16 1641 98.1 F (36.7 C)     Temp Source 06/17/16 1641 Oral     SpO2 06/17/16 1655 98 %     Weight 06/17/16 1655 180 lb (81.6 kg)     Height 06/17/16 1655 5\' 4"  (1.626 m)     Head Circumference --      Peak Flow --      Pain Score --      Pain Loc --  Pain Edu? --      Excl. in GC? --      Constitutional: Alert and oriented. Well appearing and in no acute distress. Cardiovascular: Normal rate, regular rhythm. Normal S1 and S2.  Good peripheral circulation. Respiratory: Normal respiratory effort without tachypnea or retractions. Lungs CTAB. Good air entry to the bases with no decreased or absent breath sounds. Musculoskeletal: Full range of motion to all extremities. No gross deformities appreciated. Neurologic:  Normal speech and language. No gross focal neurologic deficits are appreciated.  Skin:  Patient has repaired laceration  with 6 simple interrupted sutures.  Psychiatric: Mood and affect are normal. Speech and behavior are normal. Patient exhibits appropriate insight and judgement.   ____________________________________________   LABS (all labs ordered are listed, but only abnormal results are displayed)  Labs Reviewed - No data to display ____________________________________________  EKG   ____________________________________________  RADIOLOGY   No results found.  ____________________________________________    PROCEDURES  Procedure(s) performed:    Procedures  SUTURE REMOVAL Performed by: Orvil Feil  Consent: Verbal consent obtained. Patient identity confirmed: provided demographic data Time out: Immediately prior to procedure a "time out" was called to verify the correct patient, procedure, equipment, support staff and site/side marked as required.  Location details: Volar aspect of left forearm.   Wound Appearance: clean  Sutures/Staples Removed: 6  Facility: sutures placed in this facility Patient tolerance: Patient tolerated the procedure well with no immediate complications.   Medications - No data to display   ____________________________________________   INITIAL IMPRESSION / ASSESSMENT AND PLAN / ED COURSE  Pertinent labs & imaging results that were available during my care of the patient were reviewed by me and considered in my medical decision making (see chart for details).  Review of the [place] CSRS was performed in accordance of the [facility] prior to dispensing any controlled drugs.  Clinical Course     Assessment and Plan:  Suture Removal  Patient presents the emergency department for suture removal. Patient tolerated the procedure well. All patient questions were answered.    ____________________________________________  FINAL CLINICAL IMPRESSION(S) / ED DIAGNOSES  Final diagnoses:  Visit for suture removal      NEW MEDICATIONS STARTED  DURING THIS VISIT:  New Prescriptions   No medications on file        This chart was dictated using voice recognition software/Dragon. Despite best efforts to proofread, errors can occur which can change the meaning. Any change was purely unintentional.    Orvil Feil, PA-C 06/17/16 1741    Governor Rooks, MD 06/17/16 [phone]

## 2016-06-17 NOTE — ED Triage Notes (Signed)
Here for suture removal

## 2016-06-25 ENCOUNTER — Encounter: Payer: Self-pay | Admitting: Emergency Medicine

## 2016-06-25 ENCOUNTER — Emergency Department
Admission: EM | Admit: 2016-06-25 | Discharge: 2016-06-26 | Disposition: A | Discharge status: Home / Self Care | Payer: Self-pay | Attending: Emergency Medicine | Admitting: Emergency Medicine

## 2016-06-25 ENCOUNTER — Emergency Department: Payer: Self-pay

## 2016-06-25 DIAGNOSIS — F17210 Nicotine dependence, cigarettes, uncomplicated: Secondary | ICD-10-CM | POA: Insufficient documentation

## 2016-06-25 DIAGNOSIS — N12 Tubulo-interstitial nephritis, not specified as acute or chronic: Secondary | ICD-10-CM | POA: Insufficient documentation

## 2016-06-25 DIAGNOSIS — F1290 Cannabis use, unspecified, uncomplicated: Secondary | ICD-10-CM | POA: Insufficient documentation

## 2016-06-25 DIAGNOSIS — Z79899 Other long term (current) drug therapy: Secondary | ICD-10-CM | POA: Insufficient documentation

## 2016-06-25 DIAGNOSIS — E039 Hypothyroidism, unspecified: Secondary | ICD-10-CM | POA: Insufficient documentation

## 2016-06-25 LAB — URINALYSIS, ROUTINE W REFLEX MICROSCOPIC
BILIRUBIN URINE: NEGATIVE
Glucose, UA: NEGATIVE mg/dL
KETONES UR: NEGATIVE mg/dL
Nitrite: NEGATIVE
PH: 6 (ref 5.0–8.0)
PROTEIN: NEGATIVE mg/dL
Specific Gravity, Urine: 1.015 (ref 1.005–1.030)

## 2016-06-25 LAB — BASIC METABOLIC PANEL
ANION GAP: 4 — AB (ref 5–15)
BUN: 9 mg/dL (ref 6–20)
CALCIUM: 8.7 mg/dL — AB (ref 8.9–10.3)
CO2: 27 mmol/L (ref 22–32)
CREATININE: 0.87 mg/dL (ref 0.44–1.00)
Chloride: 107 mmol/L (ref 101–111)
GFR calc non Af Amer: >60 mL/min (ref >60)
Glucose, Bld: 96 mg/dL (ref 65–99)
Potassium: 3.9 mmol/L (ref 3.5–5.1)
SODIUM: 138 mmol/L (ref 135–145)

## 2016-06-25 LAB — CBC WITH DIFFERENTIAL/PLATELET
BASOS ABS: 0 10*3/uL (ref 0–0.1)
BASOS PCT: 0 %
EOS ABS: 0.1 10*3/uL (ref 0–0.7)
Eosinophils Relative: 3 %
HEMATOCRIT: 38 % (ref 35.0–47.0)
HEMOGLOBIN: 12.9 g/dL (ref 12.0–16.0)
Lymphocytes Relative: 35 %
Lymphs Abs: 1.8 10*3/uL (ref 1.0–3.6)
MCH: 29.5 pg (ref 26.0–34.0)
MCHC: 34 g/dL (ref 32.0–36.0)
MCV: 86.9 fL (ref 80.0–100.0)
MONO ABS: 0.3 10*3/uL (ref 0.2–0.9)
MONOS PCT: 6 %
NEUTROS ABS: 2.9 10*3/uL (ref 1.4–6.5)
NEUTROS PCT: 56 %
Platelets: 262 10*3/uL (ref 150–440)
RBC: 4.37 MIL/uL (ref 3.80–5.20)
RDW: 15.3 % — AB (ref 11.5–14.5)
WBC: 5.2 10*3/uL (ref 3.6–11.0)

## 2016-06-25 LAB — POCT PREGNANCY, URINE: PREG TEST UR: NEGATIVE

## 2016-06-25 MED ORDER — DEXTROSE 5 % IV SOLN: 1.0000 g | Freq: Once | INTRAVENOUS | Status: DC

## 2016-06-25 MED ORDER — ONDANSETRON HCL 4 MG/2ML IJ SOLN
4.0000 mg | Freq: Once | INTRAMUSCULAR | Status: AC
  Administered 2016-06-25: 4 mg via INTRAVENOUS
  Filled 2016-06-25: qty 2

## 2016-06-25 MED ORDER — CEFTRIAXONE SODIUM-DEXTROSE 1-3.74 GM-% IV SOLR
1.0000 g | Freq: Once | INTRAVENOUS | Status: AC
  Administered 2016-06-25: 1 g via INTRAVENOUS
  Filled 2016-06-25: qty 50

## 2016-06-25 MED ORDER — OXYCODONE-ACETAMINOPHEN 5-325 MG PO TABS: 2.0000 | ORAL_TABLET | Freq: Four times a day (QID) | ORAL | 0 refills | Status: DC | PRN

## 2016-06-25 MED ORDER — CIPROFLOXACIN HCL 500 MG PO TABS: 500.0000 mg | ORAL_TABLET | Freq: Two times a day (BID) | ORAL | 0 refills | Status: AC

## 2016-06-25 MED ORDER — OXYCODONE HCL 5 MG PO TABS: 5.0000 mg | ORAL_TABLET | Freq: Three times a day (TID) | ORAL | 0 refills | Status: DC | PRN

## 2016-06-25 MED ORDER — HYDROMORPHONE HCL 1 MG/ML IJ SOLN
0.5000 mg | Freq: Once | INTRAMUSCULAR | Status: AC
  Administered 2016-06-25: 0.5 mg via INTRAVENOUS
  Filled 2016-06-25: qty 1

## 2016-06-25 NOTE — ED Provider Notes (Signed)
[facility] Emergency Department Provider Note        Time seen: ----------------------------------------- 9:31 PM on 06/25/2016 -----------------------------------------    I have reviewed the triage vital signs and the nursing notes.   HISTORY  Chief Complaint Flank Pain    HPI Destiny Myers is a 41 y.o. female who presents the ER for right flank pain.Patient reports history kidney stones she's also had a history of kidney infections. Patient states she is experiencing right flank pain and particularly right-sided back pain. She states she's had some chills and nausea but denies any other complaints. Nothing makes her pain better or worse.   Past Medical History:  Diagnosis Date  . Anxiety   . Depression   . Hypothyroidism   . PTSD (post-traumatic stress disorder)     Patient Active Problem List   Diagnosis Date Noted  . Cocaine use disorder, moderate, dependence (HCC) 06/02/2016  . Self-inflicted laceration of wrist 06/02/2016  . Tobacco use disorder 06/17/2015  . PTSD (post-traumatic stress disorder) 06/17/2015  . Severe recurrent major depression without psychotic features (HCC) 06/16/2015  . Opioid use disorder, mild, abuse 06/16/2015  . Alcohol use disorder, mild, abuse 06/16/2015  . Suicide attempt 06/16/2015  . Hypothyroidism 06/16/2015    Past Surgical History:  Procedure Laterality Date  . CESAREAN SECTION    . SHOULDER SURGERY Right     Allergies Sulfa antibiotics and Tylenol [acetaminophen]  Social History Social History  Substance Use Topics  . Smoking status: Current Every Day Smoker    Packs/day: 0.50    Types: Cigarettes  . Smokeless tobacco: Never Used  . Alcohol use Yes    Review of Systems Constitutional: Negative for fever. Cardiovascular: Negative for chest pain. Respiratory: Negative for shortness of breath. Gastrointestinal: Positive for flank pain Genitourinary: Negative for  dysuria. Musculoskeletal: Positive back pain Skin: Negative for rash. Neurological: Negative for headaches, focal weakness or numbness.  10-point ROS otherwise negative.  ____________________________________________   PHYSICAL EXAM:  VITAL SIGNS: ED Triage Vitals  Enc Vitals Group     BP 06/25/16 2011 122/67     Pulse Rate 06/25/16 2011 77     Resp 06/25/16 2011 18     Temp 06/25/16 2011 98.3 F (36.8 C)     Temp Source 06/25/16 2011 Oral     SpO2 06/25/16 2011 98 %     Weight 06/25/16 2012 200 lb (90.7 kg)     Height 06/25/16 2012 5\' 4"  (1.626 m)     Head Circumference --      Peak Flow --      Pain Score 06/25/16 2013 8     Pain Loc --      Pain Edu? --      Excl. in GC? --     Constitutional: Alert and oriented. Well appearing and in no distress. Eyes: Conjunctivae are normal. PERRL. Normal extraocular movements. ENT   Head: Normocephalic and atraumatic.   Nose: No congestion/rhinnorhea.   Mouth/Throat: Mucous membranes are moist.   Neck: No stridor. Cardiovascular: Normal rate, regular rhythm. No murmurs, rubs, or gallops. Respiratory: Normal respiratory effort without tachypnea nor retractions. Breath sounds are clear and equal bilaterally. No wheezes/rales/rhonchi. Gastrointestinal: Soft and nontender. Normal bowel sounds, Right CVA tenderness is noted Musculoskeletal: Nontender with normal range of motion in all extremities. No lower extremity tenderness nor edema. Neurologic:  Normal speech and language. No gross focal neurologic deficits are appreciated.  Skin:  Skin is warm, dry and intact. No  rash noted. Psychiatric: Mood and affect are normal. Speech and behavior are normal.  ____________________________________________  ED COURSE:  Pertinent labs & imaging results that were available during my care of the patient were reviewed by me and considered in my medical decision making (see chart for details). Patient presents to the ER in no  distress with flank pain of uncertain etiology. We will assess with labs and imaging.   Procedures ____________________________________________   LABS (pertinent positives/negatives)  Labs Reviewed  URINALYSIS, ROUTINE W REFLEX MICROSCOPIC - Abnormal; Notable for the following:       Result Value   Color, Urine YELLOW (*)    APPearance CLEAR (*)    Hgb urine dipstick LARGE (*)    Leukocytes, UA SMALL (*)    Bacteria, UA RARE (*)    Squamous Epithelial / LPF 0-5 (*)    All other components within normal limits  CBC WITH DIFFERENTIAL/PLATELET - Abnormal; Notable for the following:    RDW 15.3 (*)    All other components within normal limits  BASIC METABOLIC PANEL - Abnormal; Notable for the following:    Calcium 8.7 (*)    Anion gap 4 (*)    All other components within normal limits  POCT PREGNANCY, URINE    RADIOLOGY Images were viewed by me  CT renal protocol IMPRESSION: Unchanged nonobstructing right renal stone. No obstructive uropathy or hydronephrosis.  No acute abnormality. Minimal descending colonic diverticulosis without inflammation. ____________________________________________  FINAL ASSESSMENT AND PLAN  Pyelonephritis  Plan: Patient with labs and imaging as dictated above. Patient was given Rocephin as well as IV fluids and antiemetics, Clinically with pyelonephritis. She'll be discharged with antibiotics and is encouraged to have close follow-up with her doctor in 2 days for recheck.   Emily Filbert, MD   Note: This note was generated in part or whole with voice recognition software. Voice recognition is usually quite accurate but there are transcription errors that can and very often do occur. I apologize for any typographical errors that were not detected and corrected.     Emily Filbert, MD 06/25/16 [phone]

## 2016-06-25 NOTE — ED Triage Notes (Signed)
Pt arrives to triage with hx of kidney stones. Pt states that she is experiences right flank pain. Pt denies taking medication for pain today and states that she last took pain medication last evening. Pt is in NAD at this time.

## 2016-06-25 NOTE — ED Notes (Signed)
Pt reports lower abdominal area for 3 days reports some lower back pain to right side, reports pain with urination not burning sensation denies any other symptom at present. Pt talks in complete sentences no respiratory distress noted.

## 2016-06-25 NOTE — ED Notes (Signed)
Pt reports she has taken Dilaudid for pain in the past when she had shoulder surgery

## 2016-06-26 NOTE — ED Notes (Signed)
Pt verbalizes understanding of discharge instructions.

## 2016-06-28 LAB — URINE CULTURE: Special Requests: NORMAL

## 2016-10-05 ENCOUNTER — Emergency Department: Payer: Self-pay

## 2016-10-05 ENCOUNTER — Encounter: Payer: Self-pay | Admitting: Emergency Medicine

## 2016-10-05 ENCOUNTER — Emergency Department
Admission: EM | Admit: 2016-10-05 | Discharge: 2016-10-05 | Disposition: A | Discharge status: Home / Self Care | Payer: Self-pay | Attending: Emergency Medicine | Admitting: Emergency Medicine

## 2016-10-05 DIAGNOSIS — E039 Hypothyroidism, unspecified: Secondary | ICD-10-CM | POA: Insufficient documentation

## 2016-10-05 DIAGNOSIS — Z79899 Other long term (current) drug therapy: Secondary | ICD-10-CM | POA: Insufficient documentation

## 2016-10-05 DIAGNOSIS — M25561 Pain in right knee: Secondary | ICD-10-CM | POA: Insufficient documentation

## 2016-10-05 DIAGNOSIS — Y999 Unspecified external cause status: Secondary | ICD-10-CM | POA: Insufficient documentation

## 2016-10-05 DIAGNOSIS — F17210 Nicotine dependence, cigarettes, uncomplicated: Secondary | ICD-10-CM | POA: Insufficient documentation

## 2016-10-05 DIAGNOSIS — Y939 Activity, unspecified: Secondary | ICD-10-CM | POA: Insufficient documentation

## 2016-10-05 DIAGNOSIS — W109XXA Fall (on) (from) unspecified stairs and steps, initial encounter: Secondary | ICD-10-CM | POA: Insufficient documentation

## 2016-10-05 DIAGNOSIS — Y929 Unspecified place or not applicable: Secondary | ICD-10-CM | POA: Insufficient documentation

## 2016-10-05 MED ORDER — MELOXICAM 7.5 MG PO TABS: 7.5000 mg | ORAL_TABLET | Freq: Every day | ORAL | 1 refills | Status: AC

## 2016-10-05 NOTE — ED Notes (Addendum)
Pt discharged home after verbalizing understanding of discharge instructions; nad noted.  Pt continued to ask why she was not given any pain medication. Meloxicam explained to pt and reviewed instructions that stated she can take otc pain medications. Pt seemed to be impaired (weaving and head bobbing) and this nurse asked her if she had a ride and if she had been drinking. Pt very upset and stated that she was insulted by this question. This nurse responded that it was only out of concern for pt. Pt left angry.

## 2016-10-05 NOTE — ED Triage Notes (Signed)
States she fell down step onto left knee 2 days ago  States pain is worse  Knee is swollen and tender  States she is not able to bear wt  Pt is answering questions appro but lethagric  States she did not sleep last night

## 2016-10-05 NOTE — ED Provider Notes (Signed)
[facility] Emergency Department Provider Note  ____________________________________________  Time seen: Approximately 4:45 PM  I have reviewed the triage vital signs and the nursing notes.   HISTORY  Chief Complaint Knee Pain    HPI Destiny Myers is a 41 y.o. female presenting to the emergency department with 9/10 aching right knee pain for the past 2 days. Patient states that she fell while descending stairs. Patient did not hit her head or lose consciousness. Patient denies prior traumas or surgeries affecting the right lower extremity. Patient denies radiculopathy or weakness. Patient has been ambulating without difficulty. No alleviating measures have been undertaken.    Past Medical History:  Diagnosis Date  . Anxiety   . Depression   . Hypothyroidism   . PTSD (post-traumatic stress disorder)     Patient Active Problem List   Diagnosis Date Noted  . Cocaine use disorder, moderate, dependence (HCC) 06/02/2016  . Self-inflicted laceration of wrist 06/02/2016  . Tobacco use disorder 06/17/2015  . PTSD (post-traumatic stress disorder) 06/17/2015  . Severe recurrent major depression without psychotic features (HCC) 06/16/2015  . Opioid use disorder, mild, abuse 06/16/2015  . Alcohol use disorder, mild, abuse 06/16/2015  . Suicide attempt (HCC) 06/16/2015  . Hypothyroidism 06/16/2015    Past Surgical History:  Procedure Laterality Date  . CESAREAN SECTION    . SHOULDER SURGERY Right     Prior to Admission medications   Medication Sig Start Date End Date Taking? Authorizing Provider  cloNIDine (CATAPRES) 0.1 MG tablet Take 1 tablet (0.1 mg total) by mouth 2 (two) times daily. 06/18/15   Shari Prows, MD  hydrOXYzine (ATARAX/VISTARIL) 50 MG tablet Take 1 tablet (50 mg total) by mouth every 6 (six) hours as needed (for anxiety). 06/04/16   Shari Prows, MD  levothyroxine (SYNTHROID, LEVOTHROID) 88 MCG tablet Take 1 tablet (88  mcg total) by mouth daily before breakfast. 06/18/15   Jolanta B Pucilowska, MD  meloxicam (MOBIC) 7.5 MG tablet Take 1 tablet (7.5 mg total) by mouth daily. 10/05/16 10/12/16  Orvil Feil, PA-C  metroNIDAZOLE (FLAGYL) 250 MG tablet Take 250 mg by mouth 3 (three) times daily.    Historical Provider, MD  oxyCODONE (ROXICODONE) 5 MG immediate release tablet Take 1 tablet (5 mg total) by mouth every 8 (eight) hours as needed. 06/25/16 06/25/17  Emily Filbert, MD  oxyCODONE-acetaminophen (PERCOCET) 5-325 MG tablet Take 2 tablets by mouth every 6 (six) hours as needed for moderate pain or severe pain. 06/25/16   Emily Filbert, MD  sertraline (ZOLOFT) 50 MG tablet Take 3 tablets (150 mg total) by mouth daily. 06/05/16   Shari Prows, MD  traZODone (DESYREL) 100 MG tablet Take 1 tablet (100 mg total) by mouth at bedtime as needed for sleep. 06/04/16   Shari Prows, MD    Allergies Sulfa antibiotics; Tramadol; and Tylenol [acetaminophen]  No family history on file.  Social History Social History  Substance Use Topics  . Smoking status: Current Every Day Smoker    Packs/day: 0.50    Types: Cigarettes  . Smokeless tobacco: Never Used  . Alcohol use Yes     Review of Systems  Constitutional: No fever/chills Eyes: No visual changes. No discharge ENT: No upper respiratory complaints. Cardiovascular: no chest pain. Respiratory: no cough. No SOB. Gastrointestinal: No abdominal pain.  No nausea, no vomiting.  No diarrhea.  No constipation. Musculoskeletal: Patient has right knee pain.  Skin: Negative for rash, abrasions, lacerations, ecchymosis.  Neurological: Negative for headaches, focal weakness or numbness.   ____________________________________________   PHYSICAL EXAM:  VITAL SIGNS: ED Triage Vitals  Enc Vitals Group     BP 10/05/16 1628 (!) 147/73     Pulse Rate 10/05/16 1628 (!) 111     Resp 10/05/16 1628 14     Temp 10/05/16 1628 98.3 F (36.8 C)      Temp Source 10/05/16 1628 Oral     SpO2 10/05/16 1628 96 %     Weight 10/05/16 1629 200 lb (90.7 kg)     Height 10/05/16 1629  (1.626 m)     Head Circumference --      Peak Flow --      Pain Score 10/05/16 1644 9     Pain Loc --      Pain Edu? --      Excl. in GC? --      Constitutional: Alert and oriented. Well appearing and in no acute distress. Eyes: Conjunctivae are normal. PERRL. EOMI. Head: Atraumatic. Hematological/Lymphatic/Immunilogical: No cervical lymphadenopathy. Cardiovascular: Normal rate, regular rhythm. Normal S1 and S2.  Good peripheral circulation. Respiratory: Normal respiratory effort without tachypnea or retractions. Lungs CTAB. Good air entry to the bases with no decreased or absent breath sounds. Musculoskeletal: Patient has 5 out of 5 strength in the lower extremities bilaterally. Full range of motion at the right hip, right knee and right ankle. To inspection, right knee appears effused. Patient has mild laxity with anterior cruciate ligament testing. Negative posterior drawer test. Negative ballottement. No laxity with MCL or LCL testing. Patient has pain elicited with McMurray's. Palpable dorsalis pedis pulse bilaterally and symmetrically. Neurologic:  Normal speech and language. No gross focal neurologic deficits are appreciated.  Skin:  Skin is warm, dry and intact. No rash noted. Psychiatric: Mood and affect are normal. Speech and behavior are normal. Patient exhibits appropriate insight and judgement.   ____________________________________________   LABS (all labs ordered are listed, but only abnormal results are displayed)  Labs Reviewed - No data to display ____________________________________________  EKG   ____________________________________________  RADIOLOGY Geraldo Pitter, personally viewed and evaluated these images (plain radiographs) as part of my medical decision making, as well as reviewing the written report by the  radiologist.    Dg Knee Complete 4 Views Right  Result Date: 10/05/2016 CLINICAL DATA:  Fall down stairs 2 days ago with knee pain, initial encounter EXAM: RIGHT KNEE - COMPLETE 4+ VIEW COMPARISON:  None. FINDINGS: Tricompartmental degenerative changes noted. A moderate joint effusion is noted in the suprapatellar bursa. No acute fracture or dislocation is seen. No other soft tissue abnormality is noted. IMPRESSION: Degenerative change and joint effusion. No acute bony abnormality noted. Electronically Signed   By: Alcide Clever M.D.   On: 10/05/2016 17:28    ____________________________________________    PROCEDURES  Procedure(s) performed:    Procedures    Medications - No data to display   ____________________________________________   INITIAL IMPRESSION / ASSESSMENT AND PLAN / ED COURSE  Pertinent labs & imaging results that were available during my care of the patient were reviewed by me and considered in my medical decision making (see chart for details).  Review of the [place] CSRS was performed in accordance of the [facility] prior to dispensing any controlled drugs.     Assessment and plan: Right knee pain Patient presents to the emergency department with right knee pain, effusion and laxity with anterior drawer test. I suspect right anterior cruciate ligament  sprain. DG right knee revealed no acute fractures or bony abnormalities. A knee immobilizer was placed in the emergency department and patient was discharged with Mobic for pain and inflammation. A referral was given orthopedics, Dr. Hyacinth Meeker. All patient questions were answered.   ____________________________________________  FINAL CLINICAL IMPRESSION(S) / ED DIAGNOSES  Final diagnoses:  Acute pain of right knee      NEW MEDICATIONS STARTED DURING THIS VISIT:  New Prescriptions   MELOXICAM (MOBIC) 7.5 MG TABLET    Take 1 tablet (7.5 mg total) by mouth daily.        This chart was dictated using voice  recognition software/Dragon. Despite best efforts to proofread, errors can occur which can change the meaning. Any change was purely unintentional.    Orvil Feil, PA-C 10/05/16 1756    Merrily Brittle, MD 10/05/16 2234

## 2017-01-17 ENCOUNTER — Emergency Department: Payer: Self-pay

## 2017-01-17 ENCOUNTER — Emergency Department
Admission: EM | Admit: 2017-01-17 | Discharge: 2017-01-17 | Disposition: A | Discharge status: Home / Self Care | Payer: Self-pay | Attending: Emergency Medicine | Admitting: Emergency Medicine

## 2017-01-17 DIAGNOSIS — Z79899 Other long term (current) drug therapy: Secondary | ICD-10-CM | POA: Insufficient documentation

## 2017-01-17 DIAGNOSIS — M79601 Pain in right arm: Secondary | ICD-10-CM | POA: Insufficient documentation

## 2017-01-17 DIAGNOSIS — E039 Hypothyroidism, unspecified: Secondary | ICD-10-CM | POA: Insufficient documentation

## 2017-01-17 DIAGNOSIS — R1030 Lower abdominal pain, unspecified: Secondary | ICD-10-CM | POA: Insufficient documentation

## 2017-01-17 DIAGNOSIS — F17210 Nicotine dependence, cigarettes, uncomplicated: Secondary | ICD-10-CM | POA: Insufficient documentation

## 2017-01-17 DIAGNOSIS — R52 Pain, unspecified: Secondary | ICD-10-CM

## 2017-01-17 LAB — COMPREHENSIVE METABOLIC PANEL
ALT: 14 U/L (ref 14–54)
AST: 22 U/L (ref 15–41)
Albumin: 3.7 g/dL (ref 3.5–5.0)
Alkaline Phosphatase: 82 U/L (ref 38–126)
Anion gap: 7 (ref 5–15)
BILIRUBIN TOTAL: 0.3 mg/dL (ref 0.3–1.2)
BUN: 16 mg/dL (ref 6–20)
CO2: 23 mmol/L (ref 22–32)
CREATININE: 1.11 mg/dL — AB (ref 0.44–1.00)
Calcium: 8.7 mg/dL — ABNORMAL LOW (ref 8.9–10.3)
Chloride: 108 mmol/L (ref 101–111)
Glucose, Bld: 130 mg/dL — ABNORMAL HIGH (ref 65–99)
POTASSIUM: 3.6 mmol/L (ref 3.5–5.1)
Sodium: 138 mmol/L (ref 135–145)
TOTAL PROTEIN: 6.7 g/dL (ref 6.5–8.1)

## 2017-01-17 LAB — URINALYSIS, COMPLETE (UACMP) WITH MICROSCOPIC
Bacteria, UA: NONE SEEN
Bilirubin Urine: NEGATIVE
Glucose, UA: NEGATIVE mg/dL
Ketones, ur: NEGATIVE mg/dL
LEUKOCYTES UA: NEGATIVE
Nitrite: NEGATIVE
PH: 5 (ref 5.0–8.0)
Protein, ur: 30 mg/dL — AB
SPECIFIC GRAVITY, URINE: 1.023 (ref 1.005–1.030)

## 2017-01-17 LAB — CBC WITH DIFFERENTIAL/PLATELET
BASOS ABS: 0 10*3/uL (ref 0–0.1)
Basophils Relative: 1 %
EOS PCT: 3 %
Eosinophils Absolute: 0.1 10*3/uL (ref 0–0.7)
HEMATOCRIT: 37.4 % (ref 35.0–47.0)
Hemoglobin: 12.6 g/dL (ref 12.0–16.0)
LYMPHS ABS: 1.9 10*3/uL (ref 1.0–3.6)
LYMPHS PCT: 36 %
MCH: 29 pg (ref 26.0–34.0)
MCHC: 33.6 g/dL (ref 32.0–36.0)
MCV: 86.2 fL (ref 80.0–100.0)
MONO ABS: 0.3 10*3/uL (ref 0.2–0.9)
MONOS PCT: 5 %
NEUTROS ABS: 3 10*3/uL (ref 1.4–6.5)
Neutrophils Relative %: 55 %
Platelets: 277 10*3/uL (ref 150–440)
RBC: 4.34 MIL/uL (ref 3.80–5.20)
RDW: 15.1 % — AB (ref 11.5–14.5)
WBC: 5.3 10*3/uL (ref 3.6–11.0)

## 2017-01-17 LAB — PREGNANCY, URINE: Preg Test, Ur: NEGATIVE

## 2017-01-17 LAB — LIPASE, BLOOD: LIPASE: 37 U/L (ref 11–51)

## 2017-01-17 MED ORDER — SODIUM CHLORIDE 0.9 % IV BOLUS (SEPSIS)
500.0000 mL | Freq: Once | INTRAVENOUS | Status: AC
  Administered 2017-01-17: 500 mL via INTRAVENOUS

## 2017-01-17 MED ORDER — MORPHINE SULFATE (PF) 4 MG/ML IV SOLN
4.0000 mg | Freq: Once | INTRAVENOUS | Status: AC
Start: 2017-01-17 — End: 2017-01-17
  Administered 2017-01-17: 4 mg via INTRAVENOUS
  Filled 2017-01-17: qty 1

## 2017-01-17 MED ORDER — ONDANSETRON HCL 4 MG/2ML IJ SOLN
4.0000 mg | Freq: Once | INTRAMUSCULAR | Status: AC
  Administered 2017-01-17: 4 mg via INTRAVENOUS
  Filled 2017-01-17: qty 2

## 2017-01-17 MED ORDER — IOPAMIDOL (ISOVUE-300) INJECTION 61%
100.0000 mL | Freq: Once | INTRAVENOUS | Status: AC | PRN
  Administered 2017-01-17: 100 mL via INTRAVENOUS

## 2017-01-17 MED ORDER — MORPHINE SULFATE (PF) 4 MG/ML IV SOLN
4.0000 mg | Freq: Once | INTRAVENOUS | Status: AC
  Administered 2017-01-17: 4 mg via INTRAVENOUS
  Filled 2017-01-17: qty 1

## 2017-01-17 MED ORDER — IBUPROFEN 800 MG PO TABS: 800.0000 mg | ORAL_TABLET | Freq: Four times a day (QID) | ORAL | 0 refills | Status: DC | PRN

## 2017-01-17 MED ORDER — IOPAMIDOL (ISOVUE-300) INJECTION 61%
15.0000 mL | INTRAVENOUS | Status: AC
  Administered 2017-01-17: 15 mL via ORAL
  Administered 2017-01-17: 30 mL via ORAL

## 2017-01-17 NOTE — ED Notes (Signed)
Pt resting in bed, aware of pending CT scan

## 2017-01-17 NOTE — ED Provider Notes (Signed)
[facility] Emergency Department Provider Note   ____________________________________________   First MD Initiated Contact with Patient 01/17/17 [phone]     (approximate)  I have reviewed the triage vital signs and the nursing notes.   HISTORY  Chief Complaint Abdominal Pain and Arm Pain    HPI Destiny AmmonsJennifer K Myers is a 41 y.o. female who presents to the ED from home with a chief complain of lower abdominal pain. Patient reports a one-day history of lower abdominal pain and back pain. She is currently on her period but feels this is different then regular menstrual cramps.No history of ovarian cysts or endometriosis.denies vaginal discharge or STD concerns. Denies heavier than usual period or blood clots. Denies associated fever, chills, chest pain, shortness of breath, nausea, vomiting, dysuria, diarrhea. Denies recent travel or trauma. Nothing makes her symptoms better or worse. Patient also has a secondary complaint of right arm pain for 3 weeks.Has hardware in her right shoulder and has been having sharp shooting pain down her right shoulder all the way down to her hand. She has seen her orthopedist for this and is scheduled for an EMG. Denies extremity weakness, numbness or tingling.   Past Medical History:  Diagnosis Date  . Anxiety   . Depression   . Hypothyroidism   . PTSD (post-traumatic stress disorder)     Patient Active Problem List   Diagnosis Date Noted  . Cocaine use disorder, moderate, dependence (HCC) 06/02/2016  . Self-inflicted laceration of wrist 06/02/2016  . Tobacco use disorder 06/17/2015  . PTSD (post-traumatic stress disorder) 06/17/2015  . Severe recurrent major depression without psychotic features (HCC) 06/16/2015  . Opioid use disorder, mild, abuse 06/16/2015  . Alcohol use disorder, mild, abuse 06/16/2015  . Suicide attempt (HCC) 06/16/2015  . Hypothyroidism 06/16/2015    Past Surgical History:  Procedure Laterality Date    . CESAREAN SECTION    . CHOLECYSTECTOMY    . SHOULDER SURGERY Right     Prior to Admission medications   Medication Sig Start Date End Date Taking? Authorizing Provider  cloNIDine (CATAPRES) 0.1 MG tablet Take 1 tablet (0.1 mg total) by mouth 2 (two) times daily. 06/18/15   Pucilowska, Braulio ConteJolanta B, MD  hydrOXYzine (ATARAX/VISTARIL) 50 MG tablet Take 1 tablet (50 mg total) by mouth every 6 (six) hours as needed (for anxiety). 06/04/16   Pucilowska, Braulio ConteJolanta B, MD  levothyroxine (SYNTHROID, LEVOTHROID) 88 MCG tablet Take 1 tablet (88 mcg total) by mouth daily before breakfast. 06/18/15   Pucilowska, Jolanta B, MD  metroNIDAZOLE (FLAGYL) 250 MG tablet Take 250 mg by mouth 3 (three) times daily.    [provider]  oxyCODONE (ROXICODONE) 5 MG immediate release tablet Take 1 tablet (5 mg total) by mouth every 8 (eight) hours as needed. 06/25/16 06/25/17  Emily FilbertWilliams, Jonathan E, MD  oxyCODONE-acetaminophen (PERCOCET) 5-325 MG tablet Take 2 tablets by mouth every 6 (six) hours as needed for moderate pain or severe pain. 06/25/16   Emily FilbertWilliams, Jonathan E, MD  sertraline (ZOLOFT) 50 MG tablet Take 3 tablets (150 mg total) by mouth daily. 06/05/16   Pucilowska, Braulio ConteJolanta B, MD  traZODone (DESYREL) 100 MG tablet Take 1 tablet (100 mg total) by mouth at bedtime as needed for sleep. 06/04/16   Pucilowska, Ellin GoodieJolanta B, MD    Allergies Sulfa antibiotics; Tramadol; and Tylenol [acetaminophen]  No family history on file.  Social History Social History  Substance Use Topics  . Smoking status: Current Every Day Smoker    Packs/day:  0.50    Types: Cigarettes  . Smokeless tobacco: Never Used  . Alcohol use Yes     Comment: socially    Review of Systems  Constitutional: No fever/chills. Eyes: No visual changes. ENT: No sore throat. Cardiovascular: Denies chest pain. Respiratory: Denies shortness of breath. Gastrointestinal: Positive for lower abdominal pain.  No nausea, no vomiting.  No diarrhea.  No  constipation. Genitourinary: Negative for dysuria. Musculoskeletal: Positive for right arm pain. Negative for back pain. Skin: Negative for rash. Neurological: Negative for headaches, focal weakness or numbness.   ____________________________________________   PHYSICAL EXAM:  VITAL SIGNS: ED Triage Vitals  Enc Vitals Group     BP 01/17/17 0515 (!) 152/76     Pulse Rate 01/17/17 0515 (!) 113     Resp 01/17/17 0515 19     Temp 01/17/17 0515 98.5 F (36.9 C)     Temp Source 01/17/17 0515 Oral     SpO2 01/17/17 0515 98 %     Weight 01/17/17 0516 200 lb (90.7 kg)     Height 01/17/17 0516 5\' 4"  (1.626 m)     Head Circumference --      Peak Flow --      Pain Score 01/17/17 0515 7     Pain Loc --      Pain Edu? --      Excl. in GC? --     Constitutional: Alert and oriented. Well appearing and in no acute distress. Eyes: Conjunctivae are normal. PERRL. EOMI. Head: Atraumatic. Nose: No congestion/rhinnorhea. Mouth/Throat: Mucous membranes are moist.  Oropharynx non-erythematous. Neck: No stridor.   Cardiovascular: Normal rate, regular rhythm. Grossly normal heart sounds.  Good peripheral circulation. Respiratory: Normal respiratory effort.  No retractions. Lungs CTAB. Gastrointestinal: Soft and mildly tender to palpation lower abdomen and epigastrium without rebound or guarding. No distention. No abdominal bruits. No CVA tenderness. Musculoskeletal: Full range of motion right shoulder and elbow without pain. 2+ radial pulses. Brisk, less than 5 second capillary refill. No arm swelling, warmth or erythema. No lower extremity tenderness nor edema. No joint effusions. Neurologic:  Normal speech and language. No gross focal neurologic deficits are appreciated.  Skin:  Skin is warm, dry and intact. No rash noted. Psychiatric: Mood and affect are normal. Speech and behavior are normal.  ____________________________________________   LABS (all labs ordered are listed, but only  abnormal results are displayed)  Labs Reviewed  CBC WITH DIFFERENTIAL/PLATELET - Abnormal; Notable for the following:       Result Value   RDW 15.1 (*)    All other components within normal limits  COMPREHENSIVE METABOLIC PANEL - Abnormal; Notable for the following:    Glucose, Bld 130 (*)    Creatinine, Ser 1.11 (*)    Calcium 8.7 (*)    All other components within normal limits  URINALYSIS, COMPLETE (UACMP) WITH MICROSCOPIC - Abnormal; Notable for the following:    Color, Urine YELLOW (*)    APPearance CLEAR (*)    Hgb urine dipstick LARGE (*)    Protein, ur 30 (*)    Squamous Epithelial / LPF 0-5 (*)    All other components within normal limits  LIPASE, BLOOD  PREGNANCY, URINE   ____________________________________________  EKG  None ____________________________________________  RADIOLOGY  No results found.  ____________________________________________   PROCEDURES  Procedure(s) performed: None  Procedures  Critical Care performed: No  ____________________________________________   INITIAL IMPRESSION / ASSESSMENT AND PLAN / ED COURSE  Pertinent labs & imaging results that were  available during my care of the patient were reviewed by me and considered in my medical decision making (see chart for details).  41 year old female who presents with multiple medical complaints including lower abdominal and epigastric pain status post cholecystectomy, right arm pain being evaluated by orthopedist and scheduled for EMG. Pelvic deferred per patient as she is on her menstrual period and states bleeding is heavier than usual. Will obtain screening lab work, urinalysis and start with pelvic ultrasound. If unremarkable, consider proceeding to CT abdomen/pelvis to evaluate intra-abdominal etiology.  Clinical Course as of Jan 17 701  [facility] Jan 17, 2017  1610 Patient currently in ultrasound. Care transferred to Dr. Marisa Severin pending ultrasound results. If unremarkable, consider  CT abdomen/pelvis.  [JS]    Clinical Course User Index [JS] Irean Hong, MD     ____________________________________________   FINAL CLINICAL IMPRESSION(S) / ED DIAGNOSES  Final diagnoses:  Pain  Lower abdominal pain  Right arm pain      NEW MEDICATIONS STARTED DURING THIS VISIT:  New Prescriptions   No medications on file     Note:  This document was prepared using Dragon voice recognition software and may include unintentional dictation errors.    Irean Hong, MD 01/17/17 [phone]

## 2017-01-17 NOTE — ED Notes (Signed)
Patient transported to CT 

## 2017-01-17 NOTE — ED Notes (Signed)
Patient transported to Ultrasound 

## 2017-01-17 NOTE — ED Notes (Signed)
Pt returned from US, resting in bed in no distress 

## 2017-01-17 NOTE — ED Provider Notes (Signed)
-----------------------------------------   8:20 AM on 01/17/2017 -----------------------------------------   Blood pressure 108/63, pulse 80, temperature 98.5 F (36.9 C), temperature source Oral, resp. rate 19, height 5\' 4"  (1.626 m), weight 90.7 kg (200 lb), last menstrual period 01/14/2017, SpO2 96 %.  Assuming care from Dr. Dolores FrameSung.  In short, Destiny Myers is a 41 y.o. female with a chief complaint of Abdominal Pain and Arm Pain .  Refer to the original H&P for additional details.  The current plan of care is follow-up pelvic ultrasound is negative and continued symptoms consider CT abdomen.   Clinical Course as of Jan 18 819  Mon Jan 17, 2017  [id number] Patient currently in ultrasound. Care transferred to Dr. Marisa SeverinSiadecki pending ultrasound results. If unremarkable, consider CT abdomen/pelvis.  [JS]    Clinical Course User Index [JS] Irean HongSung, Destiny J, MD    ----------------------------------------- 8:26 AM on 01/17/2017 -----------------------------------------  Pelvic ultrasound negative for acute findings. On reassessment. She reports persistent pain primarily left lower quadrant and remains mildly tender on examination. Will obtain CT abdomen and give additional analgesia.  ----------------------------------------- 10:47 AM on 01/17/2017 -----------------------------------------  CT negative for acute findings. Patient appears comfortable. On repeat exam abdomen is soft without significant tenderness. Based on negative workup, no indication for additional ED workup. Patient feels well to go home. Return precautions given, including worsening or persistent pain fever nausea vomiting.   Dionne BucySiadecki, Hervey Wedig, MD 01/17/17 [phone]

## 2017-01-17 NOTE — ED Notes (Signed)
Pt in US

## 2017-01-17 NOTE — ED Triage Notes (Signed)
Pt comes from home with c/o of lower abdominal pain that started today and shooting right arm pain that has been ongoing for about 3 weeks. PT denies vomiting, but has had some nausea. Respirations even and unlabored. Pt appears in NAD at this time.

## 2017-01-21 ENCOUNTER — Emergency Department
Admission: EM | Admit: 2017-01-21 | Discharge: 2017-01-21 | Disposition: A | Discharge status: Home / Self Care | Payer: Self-pay | Attending: Emergency Medicine | Admitting: Emergency Medicine

## 2017-01-21 ENCOUNTER — Emergency Department: Payer: Self-pay

## 2017-01-21 DIAGNOSIS — W19XXXA Unspecified fall, initial encounter: Secondary | ICD-10-CM

## 2017-01-21 DIAGNOSIS — S8991XA Unspecified injury of right lower leg, initial encounter: Secondary | ICD-10-CM

## 2017-01-21 DIAGNOSIS — F17210 Nicotine dependence, cigarettes, uncomplicated: Secondary | ICD-10-CM | POA: Insufficient documentation

## 2017-01-21 DIAGNOSIS — M25561 Pain in right knee: Secondary | ICD-10-CM | POA: Insufficient documentation

## 2017-01-21 DIAGNOSIS — Z79899 Other long term (current) drug therapy: Secondary | ICD-10-CM | POA: Insufficient documentation

## 2017-01-21 DIAGNOSIS — E039 Hypothyroidism, unspecified: Secondary | ICD-10-CM | POA: Insufficient documentation

## 2017-01-21 MED ORDER — OXYCODONE HCL 5 MG PO CAPS
5.0000 mg | ORAL_CAPSULE | Freq: Four times a day (QID) | ORAL | 0 refills | Status: DC | PRN
Start: 2017-01-21 — End: 2018-05-18

## 2017-01-21 MED ORDER — IBUPROFEN 800 MG PO TABS
800.0000 mg | ORAL_TABLET | Freq: Once | ORAL | Status: AC
  Administered 2017-01-21: 800 mg via ORAL
  Filled 2017-01-21: qty 1

## 2017-01-21 MED ORDER — OXYCODONE HCL 5 MG PO TABS
5.0000 mg | ORAL_TABLET | Freq: Once | ORAL | Status: AC
  Administered 2017-01-21: 5 mg via ORAL
  Filled 2017-01-21: qty 1

## 2017-01-21 NOTE — ED Notes (Signed)
Patient verbalizes understanding of d/c instructions and follow-up. VS stable and pain controlled per patient.  Patient in NAD at time of d/c and denies further concerns regarding this visit. Patient stable at the time of departure from the unit, departing unit by the safest and most appropriate manner per that patients condition and limitations. Patient advised to return to the ED at any time for emergent concerns, or for new/worsening symptoms.   Patient refused wheel chair. Wanted to use walker

## 2017-01-21 NOTE — ED Provider Notes (Signed)
[facility] Emergency Department Provider Note   ____________________________________________   First MD Initiated Contact with Patient 01/21/17 [phone]     (approximate)  I have reviewed the triage vital signs and the nursing notes.   HISTORY  Chief Complaint Knee Injury (right)    HPI Destiny Myers is a 41 y.o. female who presents to the ED from home with a chief complaint of right knee injury and pain. Patient was going up the steps to her family member's house who did not have a porch light, missed a step and fell, twisting her right knee in the process. Does not think she struck her right knee. Denies striking head or LOC. Complains of pain and swelling to right knee. History of patellar dislocation. Denies associated headache, neck pain, chest pain, shortness of breath, nausea, vomiting. Nothing makes her symptoms better, movement makes her symptoms worse.   Past Medical History:  Diagnosis Date  . Anxiety   . Depression   . Hypothyroidism   . PTSD (post-traumatic stress disorder)     Patient Active Problem List   Diagnosis Date Noted  . Cocaine use disorder, moderate, dependence (HCC) 06/02/2016  . Self-inflicted laceration of wrist 06/02/2016  . Tobacco use disorder 06/17/2015  . PTSD (post-traumatic stress disorder) 06/17/2015  . Severe recurrent major depression without psychotic features (HCC) 06/16/2015  . Opioid use disorder, mild, abuse 06/16/2015  . Alcohol use disorder, mild, abuse 06/16/2015  . Suicide attempt (HCC) 06/16/2015  . Hypothyroidism 06/16/2015    Past Surgical History:  Procedure Laterality Date  . CESAREAN SECTION    . CHOLECYSTECTOMY    . SHOULDER SURGERY Right     Prior to Admission medications   Medication Sig Start Date End Date Taking? Authorizing Provider  cloNIDine (CATAPRES) 0.1 MG tablet Take 1 tablet (0.1 mg total) by mouth 2 (two) times daily. 06/18/15   Pucilowska, Braulio Conte B, MD  hydrOXYzine  (ATARAX/VISTARIL) 50 MG tablet Take 1 tablet (50 mg total) by mouth every 6 (six) hours as needed (for anxiety). 06/04/16   Pucilowska, Braulio Conte B, MD  ibuprofen (ADVIL,MOTRIN) 800 MG tablet Take 1 tablet (800 mg total) by mouth every 6 (six) hours as needed. 01/17/17   Dionne Bucy, MD  levothyroxine (SYNTHROID, LEVOTHROID) 88 MCG tablet Take 1 tablet (88 mcg total) by mouth daily before breakfast. 06/18/15   Pucilowska, Jolanta B, MD  metroNIDAZOLE (FLAGYL) 250 MG tablet Take 250 mg by mouth 3 (three) times daily.    [provider]  oxycodone (OXY-IR) 5 MG capsule Take 1 capsule (5 mg total) by mouth every 6 (six) hours as needed for pain. 01/21/17   Irean Hong, MD  oxyCODONE-acetaminophen (PERCOCET) 5-325 MG tablet Take 2 tablets by mouth every 6 (six) hours as needed for moderate pain or severe pain. 06/25/16   Emily Filbert, MD  sertraline (ZOLOFT) 50 MG tablet Take 3 tablets (150 mg total) by mouth daily. 06/05/16   Pucilowska, Braulio Conte B, MD  traZODone (DESYREL) 100 MG tablet Take 1 tablet (100 mg total) by mouth at bedtime as needed for sleep. 06/04/16   Pucilowska, Ellin Goodie, MD    Allergies Sulfa antibiotics; Tramadol; and Tylenol [acetaminophen]  No family history on file.  Social History Social History  Substance Use Topics  . Smoking status: Current Every Day Smoker    Packs/day: 0.50    Types: Cigarettes  . Smokeless tobacco: Never Used  . Alcohol use Yes     Comment: socially  Review of Systems  Constitutional: No fever/chills. Eyes: No visual changes. ENT: No sore throat. Cardiovascular: Denies chest pain. Respiratory: Denies shortness of breath. Gastrointestinal: No abdominal pain.  No nausea, no vomiting.  No diarrhea.  No constipation. Genitourinary: Negative for dysuria. Musculoskeletal: Positive for right knee pain and swelling. Negative for back pain. Skin: Negative for rash. Neurological: Negative for headaches, focal weakness or  numbness.   ____________________________________________   PHYSICAL EXAM:  VITAL SIGNS: ED Triage Vitals  Enc Vitals Group     BP 01/21/17 0515 126/74     Pulse Rate 01/21/17 0515 99     Resp 01/21/17 0515 20     Temp 01/21/17 0515 98.4 F (36.9 C)     Temp Source 01/21/17 0515 Oral     SpO2 01/21/17 0515 95 %     Weight 01/21/17 0513 200 lb (90.7 kg)     Height 01/21/17 0513 5\' 4"  (1.626 m)     Head Circumference --      Peak Flow --      Pain Score 01/21/17 0513 8     Pain Loc --      Pain Edu? --      Excl. in GC? --     Constitutional: Alert and oriented. Well appearing and in no acute distress. Eyes: Conjunctivae are normal. PERRL. EOMI. Head: Atraumatic. Nose: No congestion/rhinnorhea. Mouth/Throat: Mucous membranes are moist.  Oropharynx non-erythematous. Neck: No stridor.  No cervical spine tenderness to palpation. Cardiovascular: Normal rate, regular rhythm. Grossly normal heart sounds.  Good peripheral circulation. Respiratory: Normal respiratory effort.  No retractions. Lungs CTAB. Gastrointestinal: Soft and nontender. No distention. No abdominal bruits. No CVA tenderness. Musculoskeletal: Right anterior and lateral knee tender to palpation. Limited range of motion secondary to pain. Small lateral fusion. 2+ distal pulses. Brisk, less than 5 second capillary refill.  Neurologic:  Normal speech and language. No gross focal neurologic deficits are appreciated.  Skin:  Skin is warm, dry and intact. No rash noted. Psychiatric: Mood and affect are normal. Speech and behavior are normal.  ____________________________________________   LABS (all labs ordered are listed, but only abnormal results are displayed)  Labs Reviewed - No data to display ____________________________________________  EKG  None ____________________________________________  RADIOLOGY  Dg Knee Complete 4 Views Right  Result Date: 01/21/2017 CLINICAL DATA:  Status post fall, with  twisting injury to the right knee. Pain and swelling about the right knee. Initial encounter. EXAM: RIGHT KNEE - COMPLETE 4+ VIEW COMPARISON:  Right knee radiographs performed 10/05/2016 FINDINGS: There is no evidence of fracture or dislocation. The joint spaces are preserved. Small marginal osteophytes are noted at the lateral compartment. A small to moderate knee joint effusion is seen. The visualized soft tissues are otherwise unremarkable in appearance. IMPRESSION: 1. No evidence of fracture or dislocation. 2. Small to moderate knee joint effusion noted. Electronically Signed   By: Roanna Raider M.D.   On: 01/21/2017 06:03    ____________________________________________   PROCEDURES  Procedure(s) performed: None  Procedures  Critical Care performed: No  ____________________________________________   INITIAL IMPRESSION / ASSESSMENT AND PLAN / ED COURSE  Pertinent labs & imaging results that were available during my care of the patient were reviewed by me and considered in my medical decision making (see chart for details).  41 year old female who presents with right knee injury status post mechanical fall. Will obtain x-rays, judicious use of analgesia given patient's history of polysubstance abuse and mental health history.  I reviewed the  patient's prescription history over the last 12 months in the multi-state controlled substances database(s) that includes [place], [place], [place], [place], [place], [place], [place], [place], [place], [place], [place], [place], [place], and [place].  Results were notable for #20 Norco in 08/2016.  Clinical Course as of Jan 21 618  Fri Jan 21, 2017  3559 Updated patient of imaging results. She is under the care of of orthopedic surgeon for chronic right shoulder issues. Tells me he wrote her a prescription for ibuprofen last week and she has that at home. Will place in knee immobilizer, walker and she will  follow-up with her orthopedist closely. Strict return precautions given. Patient verbalizes understanding and agrees with plan of care.  [JS]    Clinical Course User Index [JS] Irean Hong, MD     ____________________________________________   FINAL CLINICAL IMPRESSION(S) / ED DIAGNOSES  Final diagnoses:  Acute pain of right knee  Injury of right knee, initial encounter  Fall, initial encounter      NEW MEDICATIONS STARTED DURING THIS VISIT:  New Prescriptions   OXYCODONE (OXY-IR) 5 MG CAPSULE    Take 1 capsule (5 mg total) by mouth every 6 (six) hours as needed for pain.     Note:  This document was prepared using Dragon voice recognition software and may include unintentional dictation errors.    Irean Hong, MD 01/21/17 [phone]

## 2017-01-21 NOTE — Discharge Instructions (Signed)
1. You may take pain medicine as needed along with the ibuprofen you have at home. 2. Wear knee immobilizer and use walker as needed. 3. Elevate affected area and apply ice over immobilizer several times daily. 4. Return to the ER for worsening symptoms, persistent vomiting, difficulty breathing or other concerns.

## 2017-01-21 NOTE — ED Triage Notes (Signed)
Patient missed step and fell twisting right knee approx 1 hour ago. Pt has pain/swelling to knee.  Pt has hx of arthritis and dislocation to same knee.

## 2017-01-22 MED ORDER — OXYCODONE HCL 5 MG PO TABS: 5.0000 mg | ORAL_TABLET | Freq: Three times a day (TID) | ORAL | 0 refills | Status: AC | PRN

## 2017-06-24 IMAGING — CT CT RENAL STONE PROTOCOL
2 of 4 series · 16 of 46 positions shown, 18 images · non-contrast
Comparison: CT 04/14/2016

CLINICAL DATA: Right flank pain.  History of kidney stones.

EXAM:
CT ABDOMEN AND PELVIS WITHOUT CONTRAST
TECHNIQUE: Multidetector CT imaging of the abdomen and pelvis was performed
following the standard protocol without IV contrast.

[Series 2: stone full standard · axial · 0.94mm/px · z∈[-1184,-754]mm · 13 of 94 slices shown, 15 images]
[im 4/94  soft-tissue]
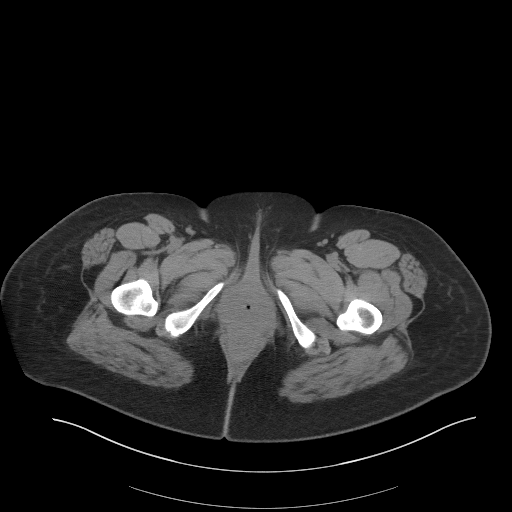
[im 4/94  bone]
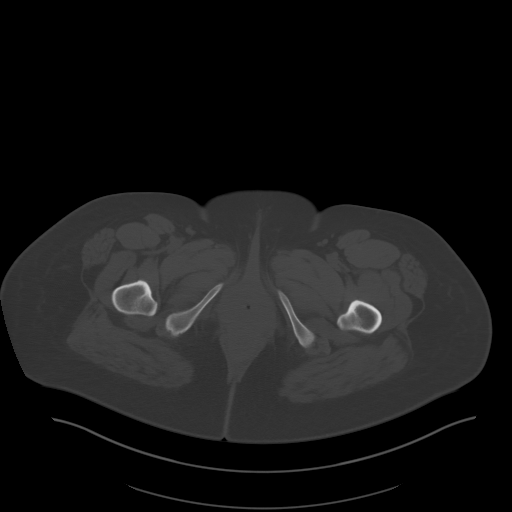
[im 12/94  soft-tissue]
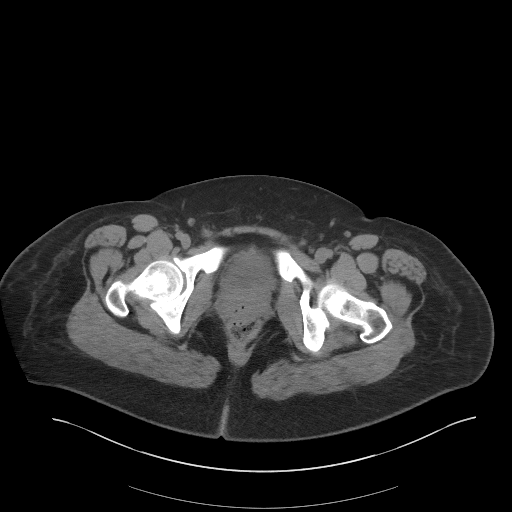
[im 20/94  soft-tissue]
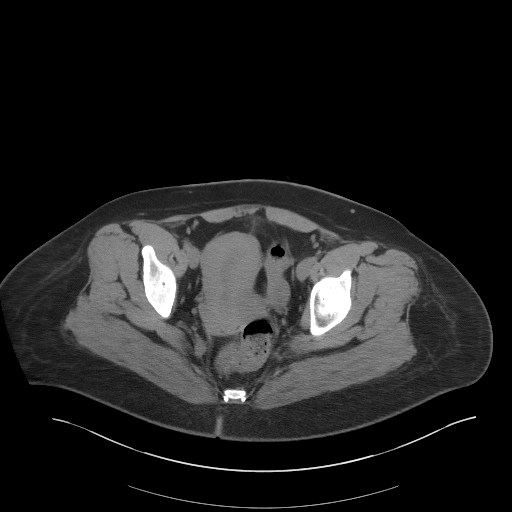
[im 28/94  soft-tissue]
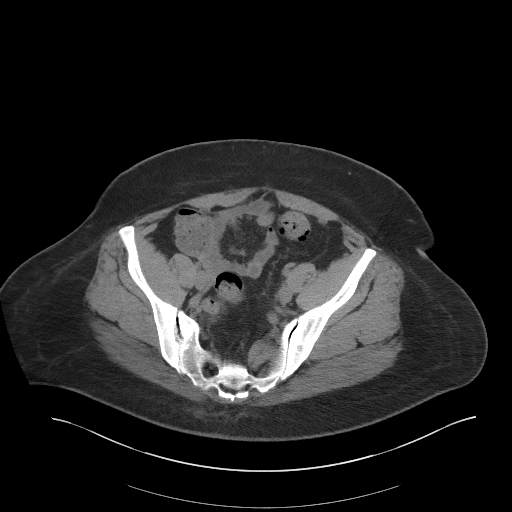
[im 32/94  soft-tissue]
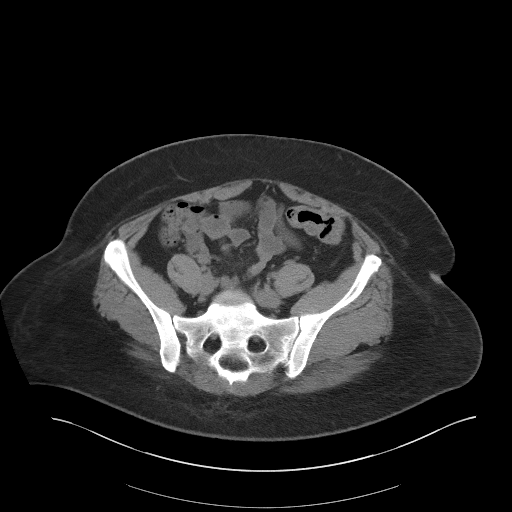
[im 39/94  soft-tissue]
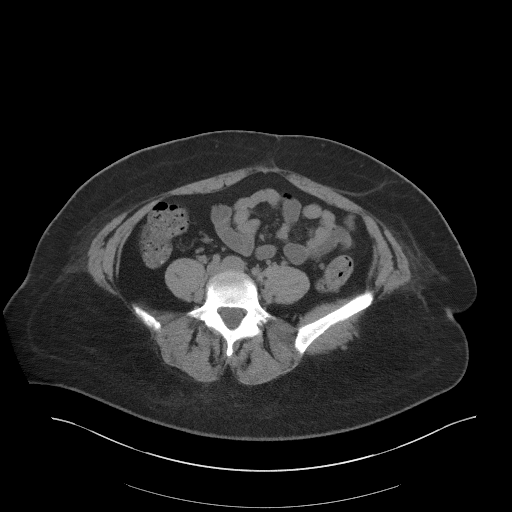
[im 47/94  soft-tissue]
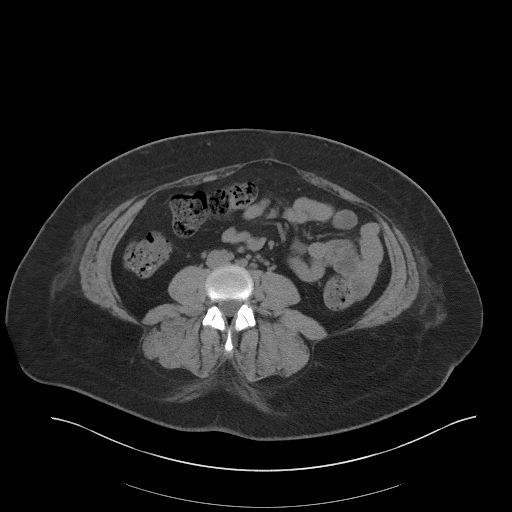
[im 55/94  soft-tissue]
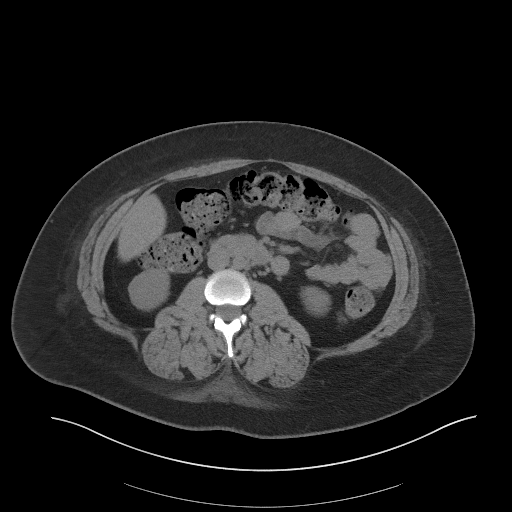
[im 63/94  soft-tissue]
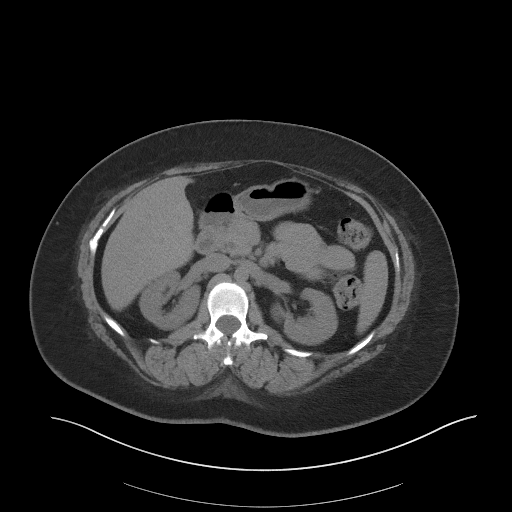
[im 63/94  bone]
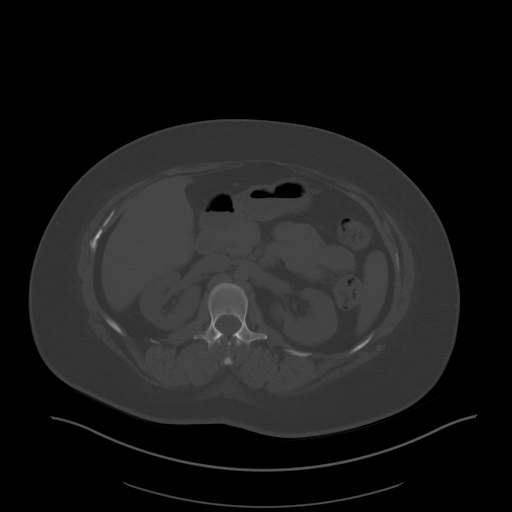
[im 66/94  soft-tissue]
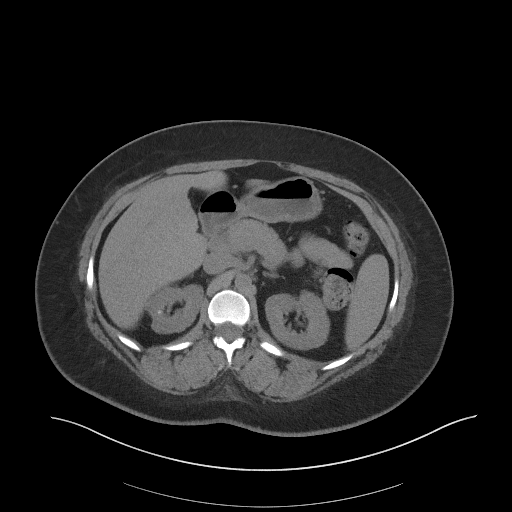
[im 74/94  soft-tissue]
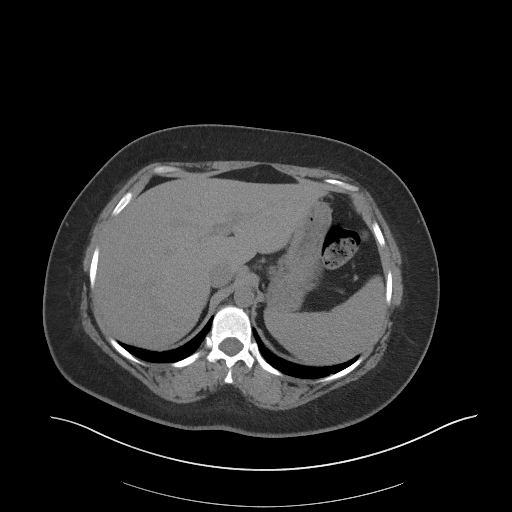
[im 82/94  soft-tissue]
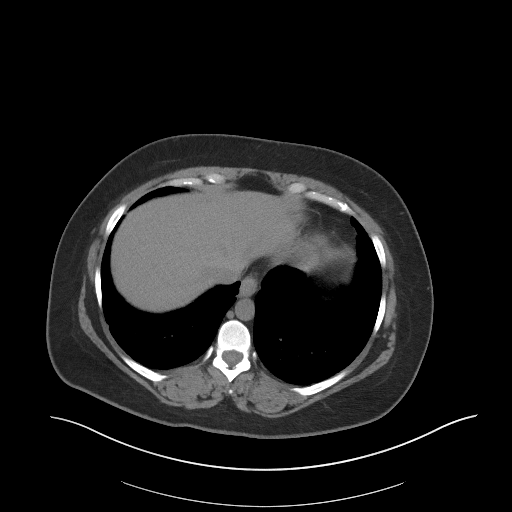
[im 90/94  soft-tissue]
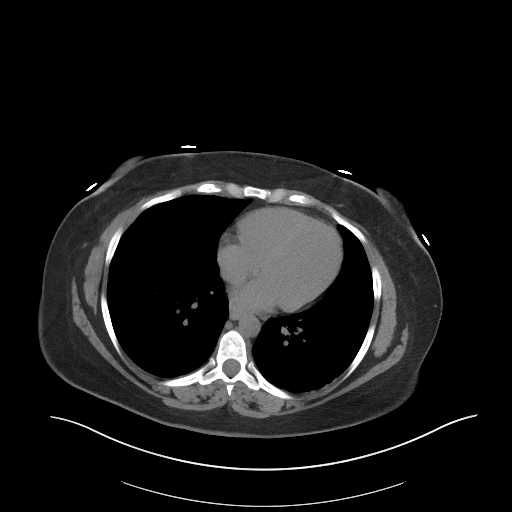

[Series 5: coronal · coronal · 0.82mm/px · 3 of 143 slices shown]
[im 48/143  soft-tissue]
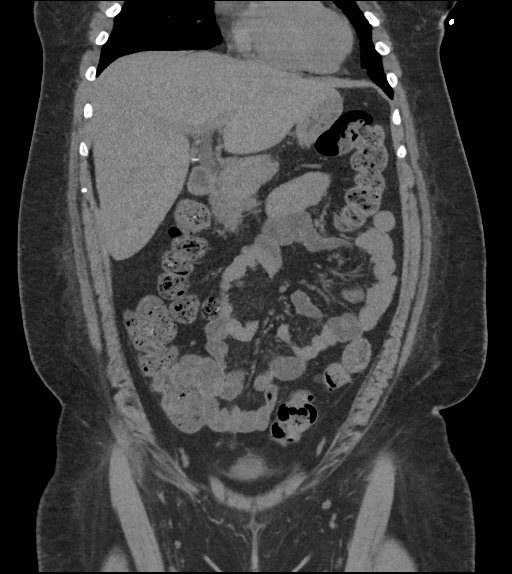
[im 64/143  soft-tissue]
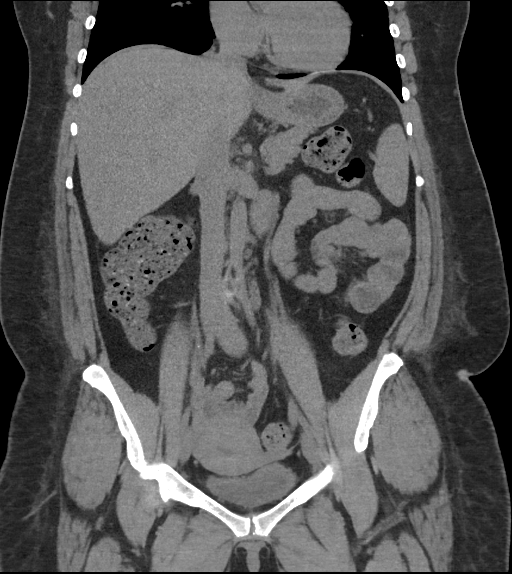
[im 79/143  soft-tissue]
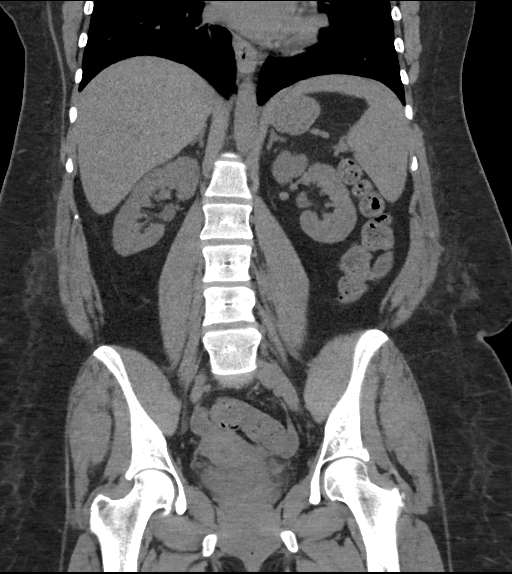

[16 of 46 positions shown; findings below may reference images not displayed]

FINDINGS: Lower chest: Minimal atelectasis in the left lower lobe. No pleural
fluid.

Hepatobiliary: No focal liver abnormality is seen. Borderline
hepatic steatosis. Status post cholecystectomy. No biliary
dilatation.

Pancreas: No ductal dilatation or inflammation.

Spleen: Normal in size without focal abnormality.

Adrenals/Urinary Tract: Unchanged 5 mm nonobstructing stone in the
upper right kidney. No hydronephrosis or obstructive uropathy. No
stones or hydronephrosis of the left kidney. Both ureters are
decompressed without stones along the course. Urinary bladder is
minimally distended and not well evaluated. There is no adrenal
nodule.

Stomach/Bowel: Minimal distal colonic diverticulosis without acute
inflammation. Moderate diffuse stool burden. The appendix is normal.
Stomach is physiologically distended. Small bowel is decompressed.

Vascular/Lymphatic: No aortic aneurysm. Small retroperitoneal nodes
not enlarged by size criteria. No pelvic adenopathy.

Reproductive: Uterus and bilateral adnexa are unremarkable.

Other: No free air, free fluid, or intra-abdominal fluid collection.
Tiny fat containing umbilical hernia.

Musculoskeletal: There are no acute or suspicious osseous
abnormalities.
IMPRESSION: Unchanged nonobstructing right renal stone. No obstructive uropathy
or hydronephrosis.

No acute abnormality. Minimal descending colonic diverticulosis
without inflammation.

## 2017-06-26 ENCOUNTER — Encounter: Payer: Self-pay | Admitting: Emergency Medicine

## 2017-06-26 ENCOUNTER — Emergency Department
Admission: EM | Admit: 2017-06-26 | Discharge: 2017-06-26 | Disposition: A | Discharge status: Home / Self Care | Payer: Self-pay | Attending: Emergency Medicine | Admitting: Emergency Medicine

## 2017-06-26 ENCOUNTER — Other Ambulatory Visit: Payer: Self-pay

## 2017-06-26 ENCOUNTER — Emergency Department: Payer: Self-pay

## 2017-06-26 DIAGNOSIS — M25561 Pain in right knee: Secondary | ICD-10-CM | POA: Insufficient documentation

## 2017-06-26 DIAGNOSIS — E039 Hypothyroidism, unspecified: Secondary | ICD-10-CM | POA: Insufficient documentation

## 2017-06-26 DIAGNOSIS — Z79899 Other long term (current) drug therapy: Secondary | ICD-10-CM | POA: Insufficient documentation

## 2017-06-26 DIAGNOSIS — F17210 Nicotine dependence, cigarettes, uncomplicated: Secondary | ICD-10-CM | POA: Insufficient documentation

## 2017-06-26 MED ORDER — MELOXICAM 15 MG PO TABS: 15.0000 mg | ORAL_TABLET | Freq: Every day | ORAL | 1 refills | Status: AC
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
## 2017-06-26 NOTE — ED Provider Notes (Signed)
[facility] Emergency Department Provider Note  ____________________________________________  Time seen: Approximately 5:48 PM  I have reviewed the triage vital signs and the nursing notes.   HISTORY  Chief Complaint Knee Pain    HPI Destiny Myers is a 42 y.o. female presents to the emergency department with lateral, aching and non-radiating 10/10 right knee pain. Patient denies falls or mechanisms of trauma. No weakness, radiculopathy or changes in sensation.  Patient reports that oxycodone helps with pain.  Patient denies history of septic knee or gout.   Past Medical History:  Diagnosis Date  . Anxiety   . Depression   . Hypothyroidism   . PTSD (post-traumatic stress disorder)     Patient Active Problem List   Diagnosis Date Noted  . Cocaine use disorder, moderate, dependence (HCC) 06/02/2016  . Self-inflicted laceration of wrist 06/02/2016  . Tobacco use disorder 06/17/2015  . PTSD (post-traumatic stress disorder) 06/17/2015  . Severe recurrent major depression without psychotic features (HCC) 06/16/2015  . Opioid use disorder, mild, abuse (HCC) 06/16/2015  . Alcohol use disorder, mild, abuse 06/16/2015  . Suicide attempt (HCC) 06/16/2015  . Hypothyroidism 06/16/2015    Past Surgical History:  Procedure Laterality Date  . CESAREAN SECTION    . CHOLECYSTECTOMY    . SHOULDER SURGERY Right     Prior to Admission medications   Medication Sig Start Date End Date Taking? Authorizing Provider  cloNIDine (CATAPRES) 0.1 MG tablet Take 1 tablet (0.1 mg total) by mouth 2 (two) times daily. 06/18/15   Pucilowska, Braulio Conte B, MD  hydrOXYzine (ATARAX/VISTARIL) 50 MG tablet Take 1 tablet (50 mg total) by mouth every 6 (six) hours as needed (for anxiety). 06/04/16   Pucilowska, Braulio Conte B, MD  ibuprofen (ADVIL,MOTRIN) 800 MG tablet Take 1 tablet (800 mg total) by mouth every 6 (six) hours as needed. 01/17/17   Dionne Bucy, MD  levothyroxine  (SYNTHROID, LEVOTHROID) 88 MCG tablet Take 1 tablet (88 mcg total) by mouth daily before breakfast. 06/18/15   Pucilowska, Jolanta B, MD  meloxicam (MOBIC) 15 MG tablet Take 1 tablet (15 mg total) by mouth daily for 7 days. 06/26/17 07/03/17  Orvil Feil, PA-C  metroNIDAZOLE (FLAGYL) 250 MG tablet Take 250 mg by mouth 3 (three) times daily.    [provider]  oxycodone (OXY-IR) 5 MG capsule Take 1 capsule (5 mg total) by mouth every 6 (six) hours as needed for pain. 01/21/17   Irean Hong, MD  oxyCODONE (ROXICODONE) 5 MG immediate release tablet Take 1 tablet (5 mg total) by mouth every 8 (eight) hours as needed. 01/22/17 01/22/18  Jene Every, MD  oxyCODONE-acetaminophen (PERCOCET) 5-325 MG tablet Take 2 tablets by mouth every 6 (six) hours as needed for moderate pain or severe pain. 06/25/16   Emily Filbert, MD  sertraline (ZOLOFT) 50 MG tablet Take 3 tablets (150 mg total) by mouth daily. 06/05/16   Pucilowska, Braulio Conte B, MD  traZODone (DESYREL) 100 MG tablet Take 1 tablet (100 mg total) by mouth at bedtime as needed for sleep. 06/04/16   Pucilowska, Ellin Goodie, MD    Allergies Sulfa antibiotics; Tramadol; and Tylenol [acetaminophen]  No family history on file.  Social History Social History   Tobacco Use  . Smoking status: Current Every Day Smoker    Packs/day: 0.50    Types: Cigarettes  . Smokeless tobacco: Never Used  Substance Use Topics  . Alcohol use: Yes    Comment: socially  . Drug use: No  Comment: last smoked a few weeks ago     Review of Systems  Constitutional: No fever/chills Eyes: No visual changes. No discharge ENT: No upper respiratory complaints. Cardiovascular: no chest pain. Respiratory: no cough. No SOB. Musculoskeletal: Patient has right knee pain.  Skin: Negative for rash, abrasions, lacerations, ecchymosis. Neurological: Negative for headaches, focal weakness or  numbness.   ____________________________________________   PHYSICAL EXAM:  VITAL SIGNS: ED Triage Vitals  Enc Vitals Group     BP 06/26/17 1621 119/74     Pulse Rate 06/26/17 1621 93     Resp 06/26/17 1621 16     Temp 06/26/17 1621 98.3 F (36.8 C)     Temp Source 06/26/17 1621 Oral     SpO2 06/26/17 1621 96 %     Weight 06/26/17 1621 195 lb (88.5 kg)     Height 06/26/17 1621 5\' 4"  (1.626 m)     Head Circumference --      Peak Flow --      Pain Score 06/26/17 1616 8     Pain Loc --      Pain Edu? --      Excl. in GC? --      Constitutional: Alert and oriented. Well appearing and in no acute distress. Eyes: Conjunctivae are normal. PERRL. EOMI. Head: Atraumatic. Cardiovascular: Normal rate, regular rhythm. Normal S1 and S2.  Good peripheral circulation. Respiratory: Normal respiratory effort without tachypnea or retractions. Lungs CTAB. Good air entry to the bases with no decreased or absent breath sounds. Musculoskeletal: Patient demonstrates full range of motion at the right knee.  No popliteal fullness.  Negative anterior and posterior drawer test, right.  No laxity with MCL or LCL testing, right.  Negative ballottement.  Palpable dorsalis pedis pulse, right Neurologic:  Normal speech and language. No gross focal neurologic deficits are appreciated.  Skin:  Skin is warm, dry and intact. No rash noted.  ____________________________________________   LABS (all labs ordered are listed, but only abnormal results are displayed)  Labs Reviewed - No data to display ____________________________________________  EKG   ____________________________________________  RADIOLOGY Geraldo Pitter, personally viewed and evaluated these images (plain radiographs) as part of my medical decision making, as well as reviewing the written report by the radiologist.  Dg Knee Complete 4 Views Right  Result Date: 06/26/2017 CLINICAL DATA:  Lateral knee pain with no new trauma. EXAM:  RIGHT KNEE - COMPLETE 4+ VIEW COMPARISON:  None. FINDINGS: Mild tricompartmental degenerative changes identified. A probable osteochondroma projects off the anterior aspect of the distal femur. No joint effusion. No fracture or dislocation. IMPRESSION: 1. Mild degenerative changes. 2. Distal femoral small osteochondroma, unchanged. Electronically Signed   By: Gerome Sam III M.D   On: 06/26/2017 17:00    ____________________________________________    PROCEDURES  Procedure(s) performed:    Procedures    Medications - No data to display   ____________________________________________   INITIAL IMPRESSION / ASSESSMENT AND PLAN / ED COURSE  Pertinent labs & imaging results that were available during my care of the patient were reviewed by me and considered in my medical decision making (see chart for details).  Review of the [place] CSRS was performed in accordance of the [facility] prior to dispensing any controlled drugs.     Assessment and Plan: Right Knee  Patient presents to the emergency department with 10 out of 10 right knee pain.  Differential diagnosis originally included meniscal tear versus knee contusion versus fracture versus ligamentous strain.  Physical exam was completely reassuring.  No acute bony abnormalities were visualized on x-ray examination of the knee.  Patient was treated empirically with meloxicam and advised to strengthen her VMO.  Arch supports were also recommended.  Patient was referred to Dr. Hyacinth MeekerMiller.  All patient questions were answered.     ____________________________________________  FINAL CLINICAL IMPRESSION(S) / ED DIAGNOSES  Final diagnoses:  Acute pain of right knee      NEW MEDICATIONS STARTED DURING THIS VISIT:  ED Discharge Orders        Ordered    meloxicam (MOBIC) 15 MG tablet  Daily     06/26/17 1745          This chart was dictated using voice recognition software/Dragon. Despite best efforts to proofread, errors can  occur which can change the meaning. Any change was purely unintentional.    Orvil FeilWoods, Lealon Vanputten M, PA-C 06/26/17 1806    Nita SickleVeronese, [place], MD 06/29/17 [phone]

## 2018-01-18 ENCOUNTER — Encounter: Payer: Self-pay | Admitting: Emergency Medicine

## 2018-01-18 ENCOUNTER — Emergency Department
Admission: EM | Admit: 2018-01-18 | Discharge: 2018-01-18 | Disposition: A | Discharge status: Home / Self Care | Payer: Self-pay | Attending: Emergency Medicine | Admitting: Emergency Medicine

## 2018-01-18 ENCOUNTER — Other Ambulatory Visit: Payer: Self-pay

## 2018-01-18 ENCOUNTER — Emergency Department: Payer: Self-pay

## 2018-01-18 DIAGNOSIS — Z79899 Other long term (current) drug therapy: Secondary | ICD-10-CM | POA: Insufficient documentation

## 2018-01-18 DIAGNOSIS — E039 Hypothyroidism, unspecified: Secondary | ICD-10-CM | POA: Insufficient documentation

## 2018-01-18 DIAGNOSIS — N3001 Acute cystitis with hematuria: Secondary | ICD-10-CM | POA: Insufficient documentation

## 2018-01-18 DIAGNOSIS — F17210 Nicotine dependence, cigarettes, uncomplicated: Secondary | ICD-10-CM | POA: Insufficient documentation

## 2018-01-18 HISTORY — DX: Calculus of kidney: N20.0

## 2018-01-18 LAB — URINALYSIS, COMPLETE (UACMP) WITH MICROSCOPIC
BACTERIA UA: NONE SEEN
BILIRUBIN URINE: NEGATIVE
Glucose, UA: NEGATIVE mg/dL
KETONES UR: NEGATIVE mg/dL
Nitrite: NEGATIVE
PROTEIN: 30 mg/dL — AB
RBC / HPF: >50 RBC/hpf — ABNORMAL HIGH (ref 0–5)
Specific Gravity, Urine: 1.027 (ref 1.005–1.030)
pH: 5 (ref 5.0–8.0)

## 2018-01-18 LAB — COMPREHENSIVE METABOLIC PANEL
ALT: 15 U/L (ref 0–44)
AST: 18 U/L (ref 15–41)
Albumin: 3.7 g/dL (ref 3.5–5.0)
Alkaline Phosphatase: 85 U/L (ref 38–126)
Anion gap: 5 (ref 5–15)
BUN: 15 mg/dL (ref 6–20)
CHLORIDE: 108 mmol/L (ref 98–111)
CO2: 24 mmol/L (ref 22–32)
Calcium: 8.4 mg/dL — ABNORMAL LOW (ref 8.9–10.3)
Creatinine, Ser: 0.9 mg/dL (ref 0.44–1.00)
GFR calc Af Amer: >60 mL/min (ref >60)
GFR calc non Af Amer: >60 mL/min (ref >60)
Glucose, Bld: 127 mg/dL — ABNORMAL HIGH (ref 70–99)
POTASSIUM: 4.2 mmol/L (ref 3.5–5.1)
SODIUM: 137 mmol/L (ref 135–145)
Total Bilirubin: 0.5 mg/dL (ref 0.3–1.2)
Total Protein: 6.6 g/dL (ref 6.5–8.1)

## 2018-01-18 LAB — CBC
HCT: 39.5 % (ref 35.0–47.0)
Hemoglobin: 13.5 g/dL (ref 12.0–16.0)
MCH: 30.4 pg (ref 26.0–34.0)
MCHC: 34.1 g/dL (ref 32.0–36.0)
MCV: 89.2 fL (ref 80.0–100.0)
PLATELETS: 351 10*3/uL (ref 150–440)
RBC: 4.43 MIL/uL (ref 3.80–5.20)
RDW: 17.7 % — AB (ref 11.5–14.5)
WBC: 8 10*3/uL (ref 3.6–11.0)

## 2018-01-18 LAB — POCT PREGNANCY, URINE: PREG TEST UR: NEGATIVE

## 2018-01-18 LAB — LIPASE, BLOOD: LIPASE: 40 U/L (ref 11–51)

## 2018-01-18 MED ORDER — CEPHALEXIN 500 MG PO CAPS: 500.0000 mg | ORAL_CAPSULE | Freq: Two times a day (BID) | ORAL | 0 refills | Status: AC

## 2018-01-18 MED ORDER — PHENAZOPYRIDINE HCL 200 MG PO TABS: 200.0000 mg | ORAL_TABLET | Freq: Three times a day (TID) | ORAL | 0 refills | Status: AC | PRN

## 2018-01-18 MED ORDER — IBUPROFEN 600 MG PO TABS: 600.0000 mg | ORAL_TABLET | Freq: Four times a day (QID) | ORAL | 0 refills | Status: DC | PRN

## 2018-01-18 MED ORDER — SODIUM CHLORIDE 0.9 % IV SOLN
1.0000 g | Freq: Once | INTRAVENOUS | Status: AC
  Administered 2018-01-18: 1 g via INTRAVENOUS
  Filled 2018-01-18: qty 10

## 2018-01-18 MED ORDER — KETOROLAC TROMETHAMINE 30 MG/ML IJ SOLN
30.0000 mg | Freq: Once | INTRAMUSCULAR | Status: AC
  Administered 2018-01-18: 30 mg via INTRAVENOUS
  Filled 2018-01-18: qty 1

## 2018-01-18 MED ORDER — MORPHINE SULFATE (PF) 4 MG/ML IV SOLN
4.0000 mg | Freq: Once | INTRAVENOUS | Status: AC
  Administered 2018-01-18: 4 mg via INTRAVENOUS
  Filled 2018-01-18: qty 1

## 2018-01-18 NOTE — ED Provider Notes (Signed)
[facility] Emergency Department Provider Note ____________________________________________   First MD Initiated Contact with Patient 01/18/18 [phone]     (approximate)  I have reviewed the triage vital signs and the nursing notes.   HISTORY  Chief Complaint Dysuria and Hematuria    HPI Destiny Myers is a 42 y.o. female with PMH as noted below who presents with lower abdominal pain, acute onset several hours ago, and starting when she urinated.  She reports dysuria and frequency since then as well as some blood in the urine.  The patient also reports right lower back pain over the last day.  She states that she has had some chills and nausea, but no vomiting.  She denies any change in her bowel movements.  No vaginal bleeding or discharge.  She states that her pain feels similar to prior UTIs.   Past Medical History:  Diagnosis Date  . Anxiety   . Depression   . Hypothyroidism   . Kidney stones   . PTSD (post-traumatic stress disorder)     Patient Active Problem List   Diagnosis Date Noted  . Cocaine use disorder, moderate, dependence (HCC) 06/02/2016  . Self-inflicted laceration of wrist 06/02/2016  . Tobacco use disorder 06/17/2015  . PTSD (post-traumatic stress disorder) 06/17/2015  . Severe recurrent major depression without psychotic features (HCC) 06/16/2015  . Opioid use disorder, mild, abuse (HCC) 06/16/2015  . Alcohol use disorder, mild, abuse 06/16/2015  . Suicide attempt (HCC) 06/16/2015  . Hypothyroidism 06/16/2015    Past Surgical History:  Procedure Laterality Date  . CESAREAN SECTION    . CHOLECYSTECTOMY    . CHOLECYSTECTOMY    . SHOULDER SURGERY Right     Prior to Admission medications   Medication Sig Start Date End Date Taking? Authorizing Provider  cephALEXin (KEFLEX) 500 MG capsule Take 1 capsule (500 mg total) by mouth 2 (two) times daily for 10 days. 01/18/18 01/28/18  Dionne BucySiadecki, Brittanni Cariker, MD  cloNIDine (CATAPRES)  0.1 MG tablet Take 1 tablet (0.1 mg total) by mouth 2 (two) times daily. 06/18/15   Pucilowska, Braulio ConteJolanta B, MD  hydrOXYzine (ATARAX/VISTARIL) 50 MG tablet Take 1 tablet (50 mg total) by mouth every 6 (six) hours as needed (for anxiety). 06/04/16   Pucilowska, Braulio ConteJolanta B, MD  ibuprofen (ADVIL,MOTRIN) 600 MG tablet Take 1 tablet (600 mg total) by mouth every 6 (six) hours as needed. 01/18/18   Dionne BucySiadecki, Kahealani Yankovich, MD  levothyroxine (SYNTHROID, LEVOTHROID) 88 MCG tablet Take 1 tablet (88 mcg total) by mouth daily before breakfast. 06/18/15   Pucilowska, Jolanta B, MD  metroNIDAZOLE (FLAGYL) 250 MG tablet Take 250 mg by mouth 3 (three) times daily.    [provider]  oxycodone (OXY-IR) 5 MG capsule Take 1 capsule (5 mg total) by mouth every 6 (six) hours as needed for pain. 01/21/17   Irean HongSung, Jade J, MD  oxyCODONE (ROXICODONE) 5 MG immediate release tablet Take 1 tablet (5 mg total) by mouth every 8 (eight) hours as needed. 01/22/17 01/22/18  Jene EveryKinner, Robert, MD  oxyCODONE-acetaminophen (PERCOCET) 5-325 MG tablet Take 2 tablets by mouth every 6 (six) hours as needed for moderate pain or severe pain. 06/25/16   Emily FilbertWilliams, Jonathan E, MD  phenazopyridine (PYRIDIUM) 200 MG tablet Take 1 tablet (200 mg total) by mouth 3 (three) times daily as needed for up to 2 days for pain. 01/18/18 01/20/18  Dionne BucySiadecki, Shavette Shoaff, MD  sertraline (ZOLOFT) 50 MG tablet Take 3 tablets (150 mg total) by mouth daily. 06/05/16  Pucilowska, Jolanta B, MD  traZODone (DESYREL) 100 MG tablet Take 1 tablet (100 mg total) by mouth at bedtime as needed for sleep. 06/04/16   Pucilowska, Ellin GoodieJolanta B, MD    Allergies Sulfa antibiotics; Tramadol; and Tylenol [acetaminophen]  History reviewed. No pertinent family history.  Social History Social History   Tobacco Use  . Smoking status: Current Every Day Smoker    Packs/day: 0.50    Types: Cigarettes  . Smokeless tobacco: Never Used  Substance Use Topics  . Alcohol use: Yes    Comment:  socially  . Drug use: No    Comment: last smoked a few weeks ago    Review of Systems  Constitutional: Positive for chills. Eyes: No redness. ENT: No sore throat. Cardiovascular: Denies chest pain. Respiratory: Denies shortness of breath. Gastrointestinal: Positive for nausea.  Genitourinary: Positive for dysuria.  Musculoskeletal: Positive for back pain. Skin: Negative for rash. Neurological: Negative for headache.   ____________________________________________   PHYSICAL EXAM:  VITAL SIGNS: ED Triage Vitals  Enc Vitals Group     BP 01/18/18 0522 (!) 148/98     Pulse Rate 01/18/18 0522 (!) 108     Resp 01/18/18 0522 18     Temp 01/18/18 0522 98.8 F (37.1 C)     Temp Source 01/18/18 0522 Oral     SpO2 01/18/18 0522 99 %     Weight 01/18/18 0521 200 lb (90.7 kg)     Height --      Head Circumference --      Peak Flow --      Pain Score 01/18/18 0530 8     Pain Loc --      Pain Edu? --      Excl. in GC? --     Constitutional: Alert and oriented.  Relatively well appearing and in no acute distress. Eyes: Conjunctivae are normal.  Head: Atraumatic. Nose: No congestion/rhinnorhea. Mouth/Throat: Mucous membranes are moist.   Neck: Normal range of motion.  Cardiovascular:  Good peripheral circulation. Respiratory: Normal respiratory effort.  No retractions.  Gastrointestinal: Soft with mild bilateral lower quadrant and suprapubic tenderness. No distention.  Genitourinary: No flank tenderness. Musculoskeletal:  Extremities warm and well perfused.  Neurologic:  Normal speech and language. No gross focal neurologic deficits are appreciated.  Skin:  Skin is warm and dry. No rash noted. Psychiatric: Mood and affect are normal. Speech and behavior are normal.  ____________________________________________   LABS (all labs ordered are listed, but only abnormal results are displayed)  Labs Reviewed  URINALYSIS, COMPLETE (UACMP) WITH MICROSCOPIC - Abnormal; Notable  for the following components:      Result Value   Color, Urine YELLOW (*)    APPearance CLOUDY (*)    Hgb urine dipstick LARGE (*)    Protein, ur 30 (*)    Leukocytes, UA MODERATE (*)    RBC / HPF >50 (*)    WBC, UA >50 (*)    All other components within normal limits  CBC - Abnormal; Notable for the following components:   RDW 17.7 (*)    All other components within normal limits  COMPREHENSIVE METABOLIC PANEL - Abnormal; Notable for the following components:   Glucose, Bld 127 (*)    Calcium 8.4 (*)    All other components within normal limits  LIPASE, BLOOD  POC URINE PREG, ED  POCT PREGNANCY, URINE   ____________________________________________  EKG   ____________________________________________  RADIOLOGY  CT abdomen: No ureteral stone or other acute abnormalities  ____________________________________________   PROCEDURES  Procedure(s) performed: No  Procedures  Critical Care performed: No ____________________________________________   INITIAL IMPRESSION / ASSESSMENT AND PLAN / ED COURSE  Pertinent labs & imaging results that were available during my care of the patient were reviewed by me and considered in my medical decision making (see chart for details).  42 year old female with PMH as noted above presents with acute onset of dysuria and lower abdominal pain as well as some hematuria since earlier in the night.  She has chills and nausea as well, and reports right lower back pain over the last day.  On exam, the patient is relatively well-appearing.  She is slightly tachycardic likely due to discomfort, but her other vital signs are normal.  The abdomen is soft with some bilateral lower/suprapubic area tenderness but no peritoneal signs.  Overall I suspect most likely UTI/cystitis.  Differential also includes ovarian cyst, or less likely colitis, diverticulitis, or appendicitis.  I have a low suspicion for ureteral stone given the suprapubic pain rather  than flank pain.  Given the urinary symptoms, I have a lower suspicion for acute gynecologic causes.  We will obtain a UA and labs.  If the UA is consistent with UTI and the lab work-up is otherwise reassuring, I will treat the patient for UTI.  If UA is equivocal or there are other concerning lab findings such as elevated WBC count I will consider imaging.  ----------------------------------------- 6:57 AM on 01/18/2018 -----------------------------------------  UA is consistent with UTI, however given the significant RBCs I obtained a CT to rule out ureteral stone.  It is negative.  The remainder of the lab work-up is unremarkable.  The patient reports significantly improved symptoms after Toradol although still some pain so I will give some additional analgesia.  She feels comfortable to go home.  We will give a dose of ceftriaxone here, and Keflex to go home.  Return precautions given, and she expresses understanding.  ____________________________________________   FINAL CLINICAL IMPRESSION(S) / ED DIAGNOSES  Final diagnoses:  Acute cystitis with hematuria      NEW MEDICATIONS STARTED DURING THIS VISIT:  New Prescriptions   CEPHALEXIN (KEFLEX) 500 MG CAPSULE    Take 1 capsule (500 mg total) by mouth 2 (two) times daily for 10 days.   IBUPROFEN (ADVIL,MOTRIN) 600 MG TABLET    Take 1 tablet (600 mg total) by mouth every 6 (six) hours as needed.   PHENAZOPYRIDINE (PYRIDIUM) 200 MG TABLET    Take 1 tablet (200 mg total) by mouth 3 (three) times daily as needed for up to 2 days for pain.     Note:  This document was prepared using Dragon voice recognition software and may include unintentional dictation errors.    Dionne Bucy, MD 01/18/18 [phone]

## 2018-01-18 NOTE — ED Triage Notes (Signed)
Pt arrived via EMS from home where pt has experienced hematuria, frequent urination and dysuria x2 days. Pt denies N/V.

## 2018-01-18 NOTE — ED Notes (Addendum)
Pt in by [facility] with co right lower back pain since yesterday. States also noted painful urination and bloody urine. Pt denies a fever but did have chills, pt does have hx of UTI states feels the same. Pt also co ruq pain states feel like when she had gallstones but has had gallbladder removed.

## 2018-01-18 NOTE — ED Notes (Signed)
Pt awaiting CT.  

## 2018-01-18 NOTE — Discharge Instructions (Addendum)
Take the antibiotic as prescribed and finish the full course.  You should take the ibuprofen as needed for pain, and the Pyridium as needed specifically for urinary pain.  Return to the ER for new, worsening, persistent severe pain, vomiting, inability to take the medication, fever, weakness, or any other new or worsening symptoms that concern you.

## 2018-05-18 ENCOUNTER — Other Ambulatory Visit (HOSPITAL_COMMUNITY)
Admission: RE | Admit: 2018-05-18 | Discharge: 2018-05-18 | Disposition: A | Discharge status: Home / Self Care | Payer: Self-pay | Source: Ambulatory Visit | Attending: Physician Assistant | Admitting: Physician Assistant

## 2018-05-18 ENCOUNTER — Ambulatory Visit: Payer: Self-pay | Admitting: Physician Assistant

## 2018-05-18 ENCOUNTER — Encounter: Payer: Self-pay | Admitting: Physician Assistant

## 2018-05-18 VITALS — BP 144/92 | HR 96 | Temp 98.1°F | Ht 64.5 in | Wt 213.0 lb

## 2018-05-18 DIAGNOSIS — M25511 Pain in right shoulder: Secondary | ICD-10-CM

## 2018-05-18 DIAGNOSIS — F419 Anxiety disorder, unspecified: Secondary | ICD-10-CM | POA: Insufficient documentation

## 2018-05-18 DIAGNOSIS — Z13220 Encounter for screening for lipoid disorders: Secondary | ICD-10-CM

## 2018-05-18 DIAGNOSIS — Z7689 Persons encountering health services in other specified circumstances: Secondary | ICD-10-CM

## 2018-05-18 DIAGNOSIS — F99 Mental disorder, not otherwise specified: Secondary | ICD-10-CM

## 2018-05-18 DIAGNOSIS — Z8639 Personal history of other endocrine, nutritional and metabolic disease: Secondary | ICD-10-CM

## 2018-05-18 DIAGNOSIS — F17200 Nicotine dependence, unspecified, uncomplicated: Secondary | ICD-10-CM

## 2018-05-18 DIAGNOSIS — G8929 Other chronic pain: Secondary | ICD-10-CM

## 2018-05-18 DIAGNOSIS — Z1239 Encounter for other screening for malignant neoplasm of breast: Secondary | ICD-10-CM

## 2018-05-18 LAB — LIPID PANEL
CHOL/HDL RATIO: 4.4 ratio
Cholesterol: 190 mg/dL (ref 0–200)
HDL: 43 mg/dL (ref >40)
LDL Cholesterol: 121 mg/dL — ABNORMAL HIGH (ref 0–99)
Triglycerides: 130 mg/dL (ref <150)
VLDL: 26 mg/dL (ref 0–40)

## 2018-05-18 LAB — COMPREHENSIVE METABOLIC PANEL
ALT: 16 U/L (ref 0–44)
AST: 20 U/L (ref 15–41)
Albumin: 4 g/dL (ref 3.5–5.0)
Alkaline Phosphatase: 88 U/L (ref 38–126)
Anion gap: 8 (ref 5–15)
BUN: 15 mg/dL (ref 6–20)
CO2: 21 mmol/L — ABNORMAL LOW (ref 22–32)
CREATININE: 0.87 mg/dL (ref 0.44–1.00)
Calcium: 9 mg/dL (ref 8.9–10.3)
Chloride: 105 mmol/L (ref 98–111)
GFR calc Af Amer: >60 mL/min (ref >60)
GFR calc non Af Amer: >60 mL/min (ref >60)
Glucose, Bld: 100 mg/dL — ABNORMAL HIGH (ref 70–99)
Potassium: 4.1 mmol/L (ref 3.5–5.1)
Sodium: 134 mmol/L — ABNORMAL LOW (ref 135–145)
Total Bilirubin: 0.6 mg/dL (ref 0.3–1.2)
Total Protein: 7.2 g/dL (ref 6.5–8.1)

## 2018-05-18 LAB — TSH: TSH: 2.786 u[IU]/mL (ref 0.350–4.500)

## 2018-05-18 MED ORDER — SERTRALINE HCL 25 MG PO TABS: 25.0000 mg | ORAL_TABLET | Freq: Every day | ORAL | 0 refills | Status: DC

## 2018-05-18 MED ORDER — GABAPENTIN 300 MG PO CAPS: 300.0000 mg | ORAL_CAPSULE | Freq: Two times a day (BID) | ORAL | 0 refills | Status: DC

## 2018-05-18 NOTE — Progress Notes (Signed)
BP (!) 144/92   Pulse 96   Temp 98.1 F (36.7 C) (Other (Comment))   Ht 5' 4.5" (1.638 m)   Wt 213 lb (96.6 kg)   LMP 05/04/2018 (Approximate)   SpO2 98%   BMI 36.00 kg/m    Subjective:    Patient ID: Destiny Myers, female    DOB: 1975-07-23, 42 y.o.   MRN: [id number]  HPI: Destiny Myers is a 42 y.o. female presenting on 05/18/2018 for New Patient (Initial Visit) (states is very nervous, BP normally normal)   HPI   Pt had PCP in [place]- she has not been seen there for 4 years  Pt complains of Lots of anxiety-   Some depression but mostly anxiety.  No SI or HI.  She states a lot of this is from recent divorce.    Pt states history of chronic pain.  Pt with chronic pain meds- given by dr Mariana Kaufman in the past (her PCP)-  For her R shoulder-  She had surgery on it twice and multiple dislocations.    Pt had seizue that started her shoulder problems- it got dislocated  Pt says she got evaluated for seizures- they thought it might have been due to going off clonazepam.    That was her only seizure ever.    Pt recently moved in with a friend who is with her today.  She is not working now.    Last PAP 2 year ago- [facility] dr Dierdre Searles  Pt saw dr Roney Mans 01/07/17 for her shoulder and EMG ordered.  Pt thinks she got the test done but didn't follow up there afterwards.   Relevant past medical, surgical, family and social history reviewed and updated as indicated. Interim medical history since our last visit reviewed. Allergies and medications reviewed and updated.   Current Outpatient Medications:  .  naproxen sodium (ALEVE) 220 MG tablet, Take 440 mg by mouth 2 (two) times daily as needed., Disp: , Rfl:    Review of Systems  Constitutional: Positive for chills, diaphoresis and fatigue. Negative for appetite change, fever and unexpected weight change.  HENT: Negative for congestion, drooling, ear pain, facial swelling, hearing loss, mouth sores, sneezing, sore  throat, trouble swallowing and voice change.   Eyes: Negative for pain, discharge, redness, itching and visual disturbance.  Respiratory: Negative for cough, choking, shortness of breath and wheezing.   Cardiovascular: Negative for chest pain, palpitations and leg swelling.  Gastrointestinal: Positive for diarrhea and vomiting. Negative for abdominal pain, blood in stool and constipation.  Endocrine: Negative for cold intolerance, heat intolerance and polydipsia.  Genitourinary: Positive for decreased urine volume. Negative for dysuria and hematuria.  Musculoskeletal: Positive for arthralgias and back pain. Negative for gait problem.  Skin: Negative for rash.  Allergic/Immunologic: Negative for environmental allergies.  Neurological: Negative for seizures, syncope, light-headedness and headaches.  Hematological: Negative for adenopathy.  Psychiatric/Behavioral: Positive for agitation and dysphoric mood. Negative for suicidal ideas. The patient is nervous/anxious.     Per HPI unless specifically indicated above     Objective:    BP (!) 144/92   Pulse 96   Temp 98.1 F (36.7 C) (Other (Comment))   Ht 5' 4.5" (1.638 m)   Wt 213 lb (96.6 kg)   LMP 05/04/2018 (Approximate)   SpO2 98%   BMI 36.00 kg/m   Wt Readings from Last 3 Encounters:  05/18/18 213 lb (96.6 kg)  01/18/18 200 lb (90.7 kg)  06/26/17 195 lb (88.5  kg)    Physical Exam Vitals signs reviewed.  Constitutional:      Appearance: She is well-developed.  HENT:     Head: Normocephalic and atraumatic.     Mouth/Throat:     Pharynx: No oropharyngeal exudate.  Eyes:     Conjunctiva/sclera: Conjunctivae normal.     Pupils: Pupils are equal, round, and reactive to light.  Neck:     Musculoskeletal: Neck supple.     Thyroid: No thyromegaly.  Cardiovascular:     Rate and Rhythm: Normal rate and regular rhythm.  Pulmonary:     Effort: Pulmonary effort is normal.     Breath sounds: Normal breath sounds.  Abdominal:      General: Bowel sounds are normal.     Palpations: Abdomen is soft. There is no mass.     Tenderness: There is no abdominal tenderness.  Musculoskeletal:     Right shoulder: She exhibits decreased range of motion and tenderness. She exhibits normal pulse.  Lymphadenopathy:     Cervical: No cervical adenopathy.  Skin:    General: Skin is warm and dry.     Comments: Bilateral wrists with well healed scars (from previous suicide attempt)  Neurological:     Mental Status: She is alert and oriented to person, place, and time.     Gait: Gait normal.  Psychiatric:        Behavior: Behavior normal.         Assessment & Plan:    Encounter Diagnoses  Name Primary?  . Encounter to establish care Yes  . History of hypothyroidism   . Screening for hyperlipidemia   . Anxiety   . Screening for breast cancer   . Other chronic pain   . Mental health disorder   . Chronic right shoulder pain   . Tobacco use disorder     -ordered screening Mammogram -[facility] where she says she had her most recent PAP about a year ago -will get Baseline labs -rx zoloft for anxiety.  She says she was on that in the past and it worked well for her.  Gave pt information for [facility]. She is recommended to go there for continuing treatment of anxiety -rx Gabapentin for neuropathy. [facility] -will Check on referral back to [facility] and to pain clinic -will monitor blood pressure and recheck at follow up OV -pt to follow up  3-4 wk.  She is to RTO sooner prn

## 2018-05-23 ENCOUNTER — Telehealth (HOSPITAL_COMMUNITY): Payer: Self-pay | Admitting: Obstetrics and Gynecology

## 2018-05-23 NOTE — Telephone Encounter (Signed)
Called patient and left voicemail message regarding scheduling screening mammogram.

## 2018-06-15 ENCOUNTER — Ambulatory Visit: Payer: Self-pay | Admitting: Physician Assistant

## 2018-07-03 ENCOUNTER — Telehealth: Payer: Self-pay | Admitting: *Deleted

## 2018-07-03 NOTE — Telephone Encounter (Signed)
Telephoned patient, left a message to return a call to [facility], regarding qualifying for mammogram.

## 2018-07-04 ENCOUNTER — Other Ambulatory Visit (HOSPITAL_COMMUNITY): Payer: Self-pay | Admitting: *Deleted

## 2018-07-04 DIAGNOSIS — N6452 Nipple discharge: Secondary | ICD-10-CM

## 2018-07-04 DIAGNOSIS — N6320 Unspecified lump in the left breast, unspecified quadrant: Secondary | ICD-10-CM

## 2018-07-11 ENCOUNTER — Other Ambulatory Visit: Payer: Self-pay

## 2018-07-11 ENCOUNTER — Ambulatory Visit (HOSPITAL_COMMUNITY): Payer: Self-pay

## 2018-07-20 ENCOUNTER — Encounter (HOSPITAL_COMMUNITY): Payer: Self-pay

## 2018-07-20 ENCOUNTER — Ambulatory Visit (HOSPITAL_COMMUNITY)
Admission: RE | Admit: 2018-07-20 | Discharge: 2018-07-20 | Disposition: A | Discharge status: Home / Self Care | Payer: Self-pay | Source: Ambulatory Visit | Attending: Obstetrics and Gynecology | Admitting: Obstetrics and Gynecology

## 2018-07-20 ENCOUNTER — Ambulatory Visit
Admission: RE | Admit: 2018-07-20 | Discharge: 2018-07-20 | Disposition: A | Discharge status: Home / Self Care | Payer: No Typology Code available for payment source | Source: Ambulatory Visit | Attending: Obstetrics and Gynecology | Admitting: Obstetrics and Gynecology

## 2018-07-20 VITALS — BP 172/94 | Wt 217.0 lb

## 2018-07-20 DIAGNOSIS — N6320 Unspecified lump in the left breast, unspecified quadrant: Secondary | ICD-10-CM

## 2018-07-20 DIAGNOSIS — N6452 Nipple discharge: Secondary | ICD-10-CM

## 2018-07-20 DIAGNOSIS — Z1239 Encounter for other screening for malignant neoplasm of breast: Secondary | ICD-10-CM

## 2018-07-20 DIAGNOSIS — N644 Mastodynia: Secondary | ICD-10-CM

## 2018-07-20 NOTE — Patient Instructions (Signed)
Explained breast self awareness with Shelbie Ammons. Patient did not need a Pap smear today due to last Pap smear was in December 2017 per patient. Let her know [facility] will cover Pap smears every 3 years unless has a history of abnormal Pap smears. Referred patient to the [facility] for a diagnostic mammogram. Appointment scheduled for Thursday, July 20, 2018 at 1310. Patient aware of appointment and will be there. Discussed smoking cessation with patient. Referred to the [facility] Quitline and gave resources to the free smoking cessation classes at [facility]. Rudean Haskell Mancino verbalized understanding.  [place], Kathaleen Maser, RN 8:27 AM

## 2018-07-20 NOTE — Progress Notes (Signed)
Complaints of left breast lump for over a month that is painful. Patient states the pain is constant and rates at a 4-5 out of 10. Patient complained of bilateral clear/milky discharge at times when expressed x 2 years. Patient stated her left breast is occasionally red but has not noticed any redness today. ; Pap Smear: Pap smear not completed today. Last Pap smear was in December 2017 in [place] and normal per patient. Per patient has a history of an abnormal Pap smear when she was 43 years old that a repeat Pap smear was completed for follow-up that was normal. Per patient has had more than three normal Pap smears since abnormal. No Pap smear results are in Epic.  Physical exam: Breasts Breasts symmetrical. No skin abnormalities bilateral breasts. No nipple retraction bilateral breasts. No nipple discharge bilateral breasts. Unable to express any nipple discharge on exam from either breast. No lymphadenopathy. No lumps palpated bilateral breasts. Unable to palpate a lump in patients area of concern. Complaints of left outer breast tenderness on exam. Referred patient to the [facility] for a diagnostic mammogram. Appointment scheduled for Thursday, July 20, 2018 at 1310.        Pelvic/Bimanual No Pap smear completed today since last Pap smear was in December 2017 per patient. Pap smear not indicated per BCCCP guidelines.   Smoking History: Patient is a current smoker. Discussed smoking cessation with patient. Referred to the [facility] Quitline and gave resources to the free smoking cessation classes at [facility].  Patient Navigation: Patient education provided. Access to services provided for patient through B[facility]CP program.   Breast and Cervical Cancer Risk Assessment: Patient has a family history of her maternal grandmother having breast cancer. Patient has no known genetic mutations or history of radiation treatment to the chest before age 56. Patient has no history of  cervical dysplasia, immunocompromised, or DES exposure in-utero.  Risk Assessment    Risk Scores      07/20/2018   Last edited by: Lynnell Dike, LPN   5-year risk: 0.5 %   Lifetime risk: 9.9 %

## 2018-07-21 ENCOUNTER — Encounter (HOSPITAL_COMMUNITY): Payer: Self-pay | Admitting: *Deleted

## 2018-07-27 ENCOUNTER — Telehealth: Payer: Self-pay

## 2018-07-27 NOTE — Telephone Encounter (Signed)
Due to the patient being scheduled on today , I will not be able to get a new patient packet to her before Monday.

## 2018-07-27 NOTE — Telephone Encounter (Signed)
Copied from CRM #[id number]. Topic: Appointment Scheduling - New Patient >> Jul 27, 2018 10:58 AM [place], Jessica L wrote: New patient has been scheduled for your office. Provider: guse  Date of Appointment: 07/31/18  Route to department's PEC pool. 

## 2018-07-28 ENCOUNTER — Telehealth: Payer: Self-pay

## 2018-07-28 NOTE — Telephone Encounter (Signed)
Copied from CRM [id number]. Topic: Appointment Scheduling - New Patient >> Jul 27, 2018 10:58 AM Fanny Bien wrote: New patient has been scheduled for your office. Provider: Jen Mow  Date of Appointment: 07/31/18  Route to department's PEC pool.

## 2018-07-28 NOTE — Telephone Encounter (Addendum)
Patient scheduled today.The patient will not be able to receive new patient paperwork before Monday.

## 2018-07-31 ENCOUNTER — Encounter: Payer: Self-pay | Admitting: Family Medicine

## 2018-07-31 ENCOUNTER — Ambulatory Visit (INDEPENDENT_AMBULATORY_CARE_PROVIDER_SITE_OTHER): Payer: Self-pay | Admitting: Family Medicine

## 2018-07-31 ENCOUNTER — Ambulatory Visit (INDEPENDENT_AMBULATORY_CARE_PROVIDER_SITE_OTHER): Payer: Self-pay

## 2018-07-31 VITALS — BP 126/78 | HR 101 | Temp 99.1°F | Resp 18 | Ht 63.5 in | Wt 222.8 lb

## 2018-07-31 DIAGNOSIS — Z9151 Personal history of suicidal behavior: Secondary | ICD-10-CM | POA: Insufficient documentation

## 2018-07-31 DIAGNOSIS — F332 Major depressive disorder, recurrent severe without psychotic features: Secondary | ICD-10-CM

## 2018-07-31 DIAGNOSIS — M542 Cervicalgia: Secondary | ICD-10-CM

## 2018-07-31 DIAGNOSIS — E559 Vitamin D deficiency, unspecified: Secondary | ICD-10-CM

## 2018-07-31 DIAGNOSIS — R202 Paresthesia of skin: Secondary | ICD-10-CM | POA: Insufficient documentation

## 2018-07-31 DIAGNOSIS — Z915 Personal history of self-harm: Secondary | ICD-10-CM

## 2018-07-31 DIAGNOSIS — E039 Hypothyroidism, unspecified: Secondary | ICD-10-CM

## 2018-07-31 DIAGNOSIS — F419 Anxiety disorder, unspecified: Secondary | ICD-10-CM

## 2018-07-31 DIAGNOSIS — F4310 Post-traumatic stress disorder, unspecified: Secondary | ICD-10-CM

## 2018-07-31 LAB — COMPREHENSIVE METABOLIC PANEL
ALT: 15 U/L (ref 0–35)
AST: 17 U/L (ref 0–37)
Albumin: 3.8 g/dL (ref 3.5–5.2)
Alkaline Phosphatase: 85 U/L (ref 39–117)
BUN: 17 mg/dL (ref 6–23)
CALCIUM: 9 mg/dL (ref 8.4–10.5)
CO2: 22 mEq/L (ref 19–32)
Chloride: 110 mEq/L (ref 96–112)
Creatinine, Ser: 0.97 mg/dL (ref 0.40–1.20)
GFR: 63.42 mL/min (ref >60.00)
Glucose, Bld: 114 mg/dL — ABNORMAL HIGH (ref 70–99)
POTASSIUM: 3.7 meq/L (ref 3.5–5.1)
Sodium: 141 mEq/L (ref 135–145)
Total Bilirubin: 0.2 mg/dL (ref 0.2–1.2)
Total Protein: 6.7 g/dL (ref 6.0–8.3)

## 2018-07-31 LAB — CBC WITH DIFFERENTIAL/PLATELET
Basophils Absolute: 0 10*3/uL (ref 0.0–0.1)
Basophils Relative: 0.5 % (ref 0.0–3.0)
Eosinophils Absolute: 0.2 10*3/uL (ref 0.0–0.7)
Eosinophils Relative: 2.6 % (ref 0.0–5.0)
HCT: 39.1 % (ref 36.0–46.0)
Hemoglobin: 12.8 g/dL (ref 12.0–15.0)
Lymphocytes Relative: 30.9 % (ref 12.0–46.0)
Lymphs Abs: 2 10*3/uL (ref 0.7–4.0)
MCHC: 32.8 g/dL (ref 30.0–36.0)
MCV: 88.3 fl (ref 78.0–100.0)
Monocytes Absolute: 0.3 10*3/uL (ref 0.1–1.0)
Monocytes Relative: 5.3 % (ref 3.0–12.0)
NEUTROS ABS: 3.9 10*3/uL (ref 1.4–7.7)
Neutrophils Relative %: 60.7 % (ref 43.0–77.0)
Platelets: 296 10*3/uL (ref 150.0–400.0)
RBC: 4.43 Mil/uL (ref 3.87–5.11)
RDW: 14.5 % (ref 11.5–15.5)
WBC: 6.4 10*3/uL (ref 4.0–10.5)

## 2018-07-31 LAB — VITAMIN D 25 HYDROXY (VIT D DEFICIENCY, FRACTURES): VITD: 14.16 ng/mL — ABNORMAL LOW (ref 30.00–100.00)

## 2018-07-31 LAB — B12 AND FOLATE PANEL
FOLATE: 6.6 ng/mL (ref >5.9)
VITAMIN B 12: 449 pg/mL (ref 211–911)

## 2018-07-31 MED ORDER — HYDROXYZINE HCL 10 MG PO TABS: 10.0000 mg | ORAL_TABLET | Freq: Three times a day (TID) | ORAL | 2 refills | Status: DC | PRN

## 2018-07-31 MED ORDER — BUSPIRONE HCL 10 MG PO TABS: 10.0000 mg | ORAL_TABLET | Freq: Two times a day (BID) | ORAL | 2 refills | Status: DC

## 2018-07-31 NOTE — Progress Notes (Signed)
Subjective:    Patient ID: Destiny Myers, female    DOB: Sep 14, 1975, 43 y.o.   MRN: [id number]  HPI   Patient presents to clinic to establish with PCP.  Main concerns today is her history of depression, PTSD and also increased anxiety.  Increased in recent anxiety is related to divorce from her husband.  Patient states been difficult to deal with.  She has not seen psychiatry in about 6 months.  Patient does have a history of suicide attempt in 2017.  She had seen a different PCP in [place] and December 2019, was prescribed Zoloft 25 mg.  Patient states does not find Zoloft helpful and stopped taking.  Patient moved back to the [place] area and this is why she is no longer going back to the PCP in [place].  Currently denies any suicidal homicidal ideation.  Patient also complains of pain in her neck and numbness and tingling that will go down both arms off and on.  She does see orthopedics regularly for treatment of right shoulder pain.  She has upcoming appointment with orthopedics at the end of this week.  Patient Active Problem List   Diagnosis Date Noted  . Cocaine use disorder, moderate, dependence (HCC) 06/02/2016  . Self-inflicted laceration of wrist 06/02/2016  . Tobacco use disorder 06/17/2015  . PTSD (post-traumatic stress disorder) 06/17/2015  . Severe recurrent major depression without psychotic features (HCC) 06/16/2015  . Opioid use disorder, mild, abuse (HCC) 06/16/2015  . Alcohol use disorder, mild, abuse 06/16/2015  . Suicide attempt (HCC) 06/16/2015  . Hypothyroidism 06/16/2015   Social History   Tobacco Use  . Smoking status: Current Every Day Smoker    Packs/day: 0.50    Types: Cigarettes  . Smokeless tobacco: Never Used  Substance Use Topics  . Alcohol use: Yes    Comment: socially   Past Surgical History:  Procedure Laterality Date  . CESAREAN SECTION    . CHOLECYSTECTOMY    . CHOLECYSTECTOMY    . SHOULDER SURGERY Right    Family History   Problem Relation Age of Onset  . COPD Mother   . Depression Mother   . Early death Mother   . Hypertension Mother   . Mental illness Mother   . Heart disease Father   . Heart attack Father   . Hyperlipidemia Father   . Hypertension Father   . Breast cancer Maternal Grandmother   . Depression Maternal Grandmother   . Cancer Paternal Grandmother   . Birth defects Paternal Grandmother      Review of Systems  Constitutional: Negative for chills, fatigue and fever.  HENT: Negative for congestion, ear pain, sinus pain and sore throat.   Eyes: Negative.   Respiratory: Negative for cough, shortness of breath and wheezing.   Cardiovascular: Negative for chest pain, palpitations and leg swelling.  Gastrointestinal: Negative for abdominal pain, diarrhea, nausea and vomiting.  Genitourinary: Negative for dysuria, frequency and urgency.  Musculoskeletal: +pain in neck, causes tingling in arms at times Skin: Negative for color change, pallor and rash.  Neurological: Negative for syncope, light-headedness and headaches.  Psychiatric/Behavioral: The patient is nervous/anxious, sad about her divorce.       Objective:   Physical Exam Vitals signs and nursing note reviewed.  Constitutional:      General: She is not in acute distress.    Appearance: She is well-developed. She is not ill-appearing or toxic-appearing.  HENT:     Head: Normocephalic and atraumatic.  Right Ear: Tympanic membrane, ear canal and external ear normal.     Left Ear: Ear canal and external ear normal.     Nose: Nose normal.     Mouth/Throat:     Mouth: Mucous membranes are moist.  Eyes:     General: No scleral icterus.       Right eye: No discharge.        Left eye: No discharge.     Extraocular Movements: Extraocular movements intact.     Conjunctiva/sclera: Conjunctivae normal.     Pupils: Pupils are equal, round, and reactive to light.  Neck:     Musculoskeletal: Normal range of motion and neck supple.  Muscular tenderness (along spinous process. grips equal and strong, UE strength equal and strong.) present. No neck rigidity.     Vascular: No carotid bruit.     Trachea: No tracheal deviation.  Cardiovascular:     Rate and Rhythm: Normal rate and regular rhythm.     Heart sounds: Normal heart sounds. No murmur. No friction rub. No gallop.   Pulmonary:     Effort: Pulmonary effort is normal. No respiratory distress.     Breath sounds: Normal breath sounds. No wheezing or rales.  Chest:     Chest wall: No tenderness.  Abdominal:     General: Bowel sounds are normal.     Palpations: Abdomen is soft.     Tenderness: There is no abdominal tenderness. There is no guarding.  Musculoskeletal: Normal range of motion.        General: No deformity.  Lymphadenopathy:     Cervical: No cervical adenopathy.  Skin:    General: Skin is warm and dry.     Capillary Refill: Capillary refill takes less than 2 seconds.     Coloration: Skin is not pale.     Findings: No erythema.  Neurological:     Mental Status: She is alert and oriented to person, place, and time.     Cranial Nerves: No cranial nerve deficit.     Gait: Gait normal.  Psychiatric:        Attention and Perception: Attention normal.        Behavior: Behavior normal. Behavior is cooperative.        Thought Content: Thought content normal. Thought content is not paranoid or delusional. Thought content does not include homicidal or suicidal ideation.        Cognition and Memory: Cognition normal.     Comments: Tearful when speaking about her divorce    Vitals:   07/31/18 1307  BP: 126/78  Pulse: (!) 101  Resp: 18  Temp: 99.1 F (37.3 C)  SpO2: 96%   GAD 7 : Generalized Anxiety Score 07/31/2018  Nervous, Anxious, on Edge 1  Control/stop worrying 2  Worry too much - different things 1  Trouble relaxing 1  Restless 1  Easily annoyed or irritable 1  Afraid - awful might happen 2  Total GAD 7 Score 9  Anxiety Difficulty Somewhat  difficult    Depression screen PHQ 2/9 07/31/2018  Decreased Interest 1  Down, Depressed, Hopeless 2  PHQ - 2 Score 3  Altered sleeping 2  Tired, decreased energy 1  Change in appetite 1  Feeling bad or failure about yourself  2  Trouble concentrating 1  Moving slowly or fidgety/restless 1  Suicidal thoughts 0  PHQ-9 Score 11       Assessment & Plan:    A total of 45 minutes were  spent face-to-face with the patient during this encounter and over half of that time was spent on counseling and coordination of care. The patient was counseled on medications, referrals placed, lab work results from past and labs we are doing today.   Severe major depressive disorder, PTSD, anxiety, history of suicide attempt - patient is agreeable to trial BuSpar 10 mg twice daily.  States she has been on multiple different medications the past nothing seems to work that well for her.  Does not recall trying BuSpar, so is open to giving this medication a chance.  She will use hydroxyzine as needed for breakthrough anxiety.  Explained to patient that due to her drug abuse history as well as addictive potential of benzodiazepines, I prefer not to prescribe her any benzodiazepines.  Patient understands.  Referral placed for psychiatry and also for psychology so patient can have counseling sessions.  Neck pain/tingling of upper extremities - we will get x-ray of neck in clinic.  Suspect the tingling could be from inflammation/nerve irritation related to neck issues.  Blood work collected in clinic today.  Patient will follow-up here in 2 weeks for recheck on mood after starting BuSpar.  She is aware she will be contacted from psychiatry and psychology to set up counseling sessions and psychiatry appointment.

## 2018-08-01 LAB — THYROID PANEL WITH TSH
Free Thyroxine Index: 2.2 (ref 1.4–3.8)
T3 Uptake: 33 % (ref 22–35)
T4, Total: 6.8 ug/dL (ref 5.1–11.9)
TSH: 6.48 mIU/L — ABNORMAL HIGH

## 2018-08-02 MED ORDER — LEVOTHYROXINE SODIUM 25 MCG PO TABS: 25.0000 ug | ORAL_TABLET | Freq: Every day | ORAL | 1 refills | Status: DC

## 2018-08-02 MED ORDER — VITAMIN D (ERGOCALCIFEROL) 1.25 MG (50000 UNIT) PO CAPS: 50000.0000 [IU] | ORAL_CAPSULE | ORAL | 0 refills | Status: DC

## 2018-08-02 NOTE — Addendum Note (Signed)
Addended byLeanora Cover on: 08/02/2018 02:12 PM   Modules accepted: Orders

## 2018-08-03 ENCOUNTER — Ambulatory Visit: Payer: Self-pay | Admitting: *Deleted

## 2018-08-03 NOTE — Telephone Encounter (Signed)
Called Pt with information about not taking Buspar for today, checking BP, and signs to be aware of to call us or go to emergency room if she experience any of theses symptoms. Pt stated she understood. Pt stated that she has a Orthopedic appt tomorrow and she really didn't want to come to our office tomorrow

## 2018-08-03 NOTE — Telephone Encounter (Signed)
PEC RN called office to alert PCP of patient's high blood pressure.  PEC RN stated that she has advised patient to withhold her morning dosage of Buspar until she hears from PCP.  PEC RN requested note to be forwarded to PCP Leanora Cover, NP.  Made PCP aware.  Forwarded note.

## 2018-08-03 NOTE — Telephone Encounter (Signed)
Patient is calling to report she is not having good response to antidepressant( Buspar). Patient states she feels more anxious and her BP is up. Patient started medication Monday/Tuesday- by day 2 patient states she feels more anxious, BP and pulse are up. Patient reports she has history of seizures and she does not like how she is feeling. Patient normally takes her medication am/pm. Last dose was last night. Patient is holding morning dose until she hears from PCP. Patient is reporting BP of 198/111 and P 100 this morning. 182/99 P 96 this morning. Offered appointment- but patient is not driving and she has to arrange transportation. Patient has appointment in [facility] tomorrow- late afternoon- discussed appointment tomorrow morning- she states she can see if she can find a ride. Told patient would send message to office to see what her PCP thinks is her best course of action.  Reason for Disposition . Systolic BP  >= 180 OR Diastolic >= 110  Answer Assessment - Initial Assessment Questions 1. BLOOD PRESSURE: "What is the blood pressure?" "Did you take at least two measurements 5 minutes apart?"     198/111   182/99 2. ONSET: "When did you take your blood pressure?"     8:25 3. HOW: "How did you obtain the blood pressure?" (e.g., visiting nurse, automatic home BP monitor)     Automatic cuff 4. HISTORY: "Do you have a history of high blood pressure?"     No history of high BP 5. MEDICATIONS: "Are you taking any medications for blood pressure?" "Have you missed any doses recently?"     n/a 6. OTHER SYMPTOMS: "Do you have any symptoms?" (e.g., headache, chest pain, blurred vision, difficulty breathing, weakness)     Stomach upset-diarrhea, some chest heaviness- anxiety 7. PREGNANCY: "Is there any chance you are pregnant?" "When was your last menstrual period?"     No- LMP- 08/01/18  Protocols used: HIGH BLOOD PRESSURE-A-AH

## 2018-08-03 NOTE — Telephone Encounter (Signed)
Hold buspar for today.   Please have her come in for appt tomorrow -- I have lots of openings.   Drink water, rest, take deep breaths. Recheck BP in 30-60 minutes  If develops symptoms like severe headache, vision changes, loss of vision, numbness/weakness on one side of body, facial droop, speech difficulty, chest pain, shortness of breath to call office and/or go to emergency room right away for evaluation

## 2018-08-03 NOTE — Telephone Encounter (Signed)
Called Pt about future appt and she stated she will be in on 3/9, The Pt also stated she checked her BP is starting to go down its at 165/93 now.

## 2018-08-03 NOTE — Telephone Encounter (Signed)
OK if she cannot come tomorrow she can come next week or keep scheduled appt on 3/9

## 2018-08-14 ENCOUNTER — Telehealth: Payer: Self-pay | Admitting: Family Medicine

## 2018-08-14 ENCOUNTER — Encounter: Payer: Self-pay | Admitting: Family Medicine

## 2018-08-14 ENCOUNTER — Telehealth: Payer: Self-pay

## 2018-08-14 ENCOUNTER — Ambulatory Visit (INDEPENDENT_AMBULATORY_CARE_PROVIDER_SITE_OTHER): Payer: Self-pay | Admitting: Family Medicine

## 2018-08-14 VITALS — BP 142/98 | HR 101 | Temp 98.5°F | Resp 16 | Ht 63.5 in | Wt 222.6 lb

## 2018-08-14 DIAGNOSIS — Z9151 Personal history of suicidal behavior: Secondary | ICD-10-CM

## 2018-08-14 DIAGNOSIS — F419 Anxiety disorder, unspecified: Secondary | ICD-10-CM

## 2018-08-14 DIAGNOSIS — Z915 Personal history of self-harm: Secondary | ICD-10-CM

## 2018-08-14 DIAGNOSIS — F4310 Post-traumatic stress disorder, unspecified: Secondary | ICD-10-CM

## 2018-08-14 DIAGNOSIS — F332 Major depressive disorder, recurrent severe without psychotic features: Secondary | ICD-10-CM

## 2018-08-14 MED ORDER — HYDROXYZINE HCL 10 MG PO TABS: 10.0000 mg | ORAL_TABLET | Freq: Three times a day (TID) | ORAL | 2 refills | Status: DC | PRN

## 2018-08-14 NOTE — Telephone Encounter (Signed)
Pt called in requesting a psychiatry referral to a provider that is in the [facility] network or [facility] network.  Pt also requested to be prescribed medication for anxiety.    FYI:  Pt scheduled flu shot for next week.

## 2018-08-14 NOTE — Telephone Encounter (Signed)
Referral to psychiatry and psychology counseling were both placed on 07/31/2018.  Referral coordinator is working on getting his referral appointment set up.  I discussed with patient that I am not willing to prescribe her Klonopin only for control of anxiety because Klonopin is a benzodiazepine and is a controlled substance, these are not meant for chronic use to control anxiety..  I would be happy to try one of the SSRIs such as Lexapro, Celexa, Zoloft to see if they can help slightly with anxiety.  She has hydroxyzine to use if needed for breakthrough anxiety, I can renew this as well.

## 2018-08-14 NOTE — Progress Notes (Signed)
Subjective:    Patient ID: Destiny Myers, female    DOB: Oct 01, 1975, 43 y.o.   MRN: [id number]  HPI   Patient presents to clinic to discuss anxiety after medication adjustments.  Patient has been on multiple medications in the past including Paxil, Prozac, Celexa, Lexapro, Zoloft, Effexor, Wellbutrin and states none of them have worked for her.  Most recently was on Zoloft at her previous PCP back in December 2019, only took for 2 weeks but then stopped taking due to being of effective.  At our first visit on 07/31/2018, we discussed different options.  Patient was agreeable to try BuSpar twice daily.  Patient took BuSpar for a few days, but and ended up having elevated blood pressures so stopped taking.  Patient states the only medication in the past that she remembers that worked for her was clonazepam.  Patient states also attempted to make appointment with psychiatrist, but they wanted $300 upfront for her to be seen.  Patient is currently under the charity care insurance through the hospital, so she is not sure as to why they would want that much money upfront, states she cannot afford it.  Patient Active Problem List   Diagnosis Date Noted  . History of suicide attempt 07/31/2018  . Anxiety 07/31/2018  . Neck pain 07/31/2018  . Tingling of upper extremity 07/31/2018  . Cocaine use disorder, moderate, dependence (HCC) 06/02/2016  . Self-inflicted laceration of wrist 06/02/2016  . Tobacco use disorder 06/17/2015  . PTSD (post-traumatic stress disorder) 06/17/2015  . Severe recurrent major depression without psychotic features (HCC) 06/16/2015  . Opioid use disorder, mild, abuse (HCC) 06/16/2015  . Alcohol use disorder, mild, abuse 06/16/2015  . Suicide attempt (HCC) 06/16/2015  . Hypothyroidism 06/16/2015   Social History   Tobacco Use  . Smoking status: Current Every Day Smoker    Packs/day: 0.50    Types: Cigarettes  . Smokeless tobacco: Never Used  Substance Use  Topics  . Alcohol use: Yes    Comment: socially   Review of Systems  Constitutional: Negative for chills, fatigue and fever.  HENT: Negative for congestion, ear pain, sinus pain and sore throat.   Eyes: Negative.   Respiratory: Negative for cough, shortness of breath and wheezing.   Cardiovascular: Negative for chest pain, palpitations and leg swelling.  Gastrointestinal: Negative for abdominal pain, diarrhea, nausea and vomiting.  Genitourinary: Negative for dysuria, frequency and urgency.  Musculoskeletal: Negative for arthralgias and myalgias.  Skin: Negative for color change, pallor and rash.  Neurological: Negative for syncope, light-headedness and headaches.  Psychiatric/Behavioral: The patient is nervous/anxious.       Objective:   Physical Exam   Constitutional: She appears well-developed and well-nourished.  Well-groomed. HENT:  Head: Normocephalic and atraumatic.  Eyes: Pupils are equal, round, and reactive to light. EOM are normal. No scleral icterus.  Neck: Normal range of motion. Neck supple. No tracheal deviation present.  Cardiovascular: Normal rate, regular rhythm and normal heart sounds.  Pulmonary/Chest: Effort normal and breath sounds normal. No respiratory distress. Neurological: She is alert and oriented to person, place, and time. Gait normal  Skin: Skin is warm and dry. No pallor.  Psychiatric: Patient is tearful.  Becomes upset in clinic when I make her aware that I am not willing to prescribe clonazepam as the only form of medication to help her anxiety and depression.  Nursing note and vitals reviewed.   Vitals:   08/14/18 1338  BP: (!) 142/98  Pulse: (!) 101  Resp: 16  Temp: 98.5 F (36.9 C)  SpO2: 98%       Assessment & Plan:    Anxiety, depression, history of suicide attempt-patient very tearful and upset when I make her aware that I am not willing to start her on clonazepam to control her anxiety and depression symptoms.  Explained to  patient that due to her past history with severe anxiety and depression, suicide attempt, drug and alcohol abuse, I really want her to see a psychiatrist due to them being experts in the field and experts on the different types of medications since all of the classic SSRIs have been ineffective.  Patient becomes more upset, and gets up and walks out of the clinic stating that when she tried to make appoint with psychiatrist they wanted $300 and she cannot afford this.  Reassured patient that I will reach out and figure out why they would ask her for that money out from a, and we will work on getting her scheduled with psychiatrist so we can get her treated appropriately.  Explained to patient that using clonazepam, which is a benzodiazepine, as the only form to treat anxiety and depression is not the best plan of care as benzodiazepines are addictive.  When walking out of clinic, patient states "call me when you figure out what you can do for me and if I can see the psychiatrist"

## 2018-08-14 NOTE — Telephone Encounter (Signed)
Spoke to [place] for clarification. She was not sure since it is psychiatry. I sent her referral to [facility] in hopes that she can be seen there without having to pay $300. I will also send her referral to [facility] since it looks like she has been seen there recently and has their charity care.

## 2018-08-14 NOTE — Telephone Encounter (Signed)
Patient is under the charity care insurance through [place].  Patient really needs psychiatric specialist evaluation due to past medical history of anxiety, drug and alcohol abuse, PTSD, suicide attempt.  Patient has tried multiple SSRIs including Prozac, Paxil, Celexa, Lexapro, Effexor, Wellbutrin and most recently BuSpar without any effect.  Patient requesting to be on Klonopin.  Patient advised that I do not prescribe Klonopin as a chronic use medication for anxiety.  Patient states when she was calling to make a psychiatric appointment, she was told she needed to pay $300 for the visit.  If patient is under the charity care insurance, I am not sure why she was asked to pay this amount.  Patient needs psychiatric specialty due to her history and difficulty with medications.

## 2018-08-15 ENCOUNTER — Telehealth: Payer: Self-pay | Admitting: Family Medicine

## 2018-08-15 ENCOUNTER — Telehealth: Payer: Self-pay | Admitting: Lab

## 2018-08-15 DIAGNOSIS — F419 Anxiety disorder, unspecified: Secondary | ICD-10-CM

## 2018-08-15 MED ORDER — CLONIDINE HCL 0.1 MG PO TABS: 0.1000 mg | ORAL_TABLET | Freq: Two times a day (BID) | ORAL | 1 refills | Status: DC

## 2018-08-15 NOTE — Telephone Encounter (Signed)
Pt called [facility] Medication: cloNIDine (CATAPRES) tablet 0.1 mg   Pt seen yesterday.  Pt states the [facility] psychiatrist put her on a waiting list. Pt would like to know if [facility] will fill this med for her until she can be seen. Pt states this med helped with her anxiety.  Pt states she has tried everything for anxiety.  MEDICAL [address] [place], [place] - [address]    [phone] (Phone) [phone] (Fax)

## 2018-08-15 NOTE — Telephone Encounter (Signed)
Pt called [facility] Pt states she called the pharmacist and that the information she is receiving is conflicting. Pt wanting a 1 month refill on clonidine. [place] cannot see her until April/May with the psychiatrist.  Pharmacist told her that clonidine can be used for BP but also for anxiety and ADHD. Pt said she will not be using long term. Pharmacist, Dawn, said this is a great medication for anxiety since clonazepam will not be prescribed. Pt states she has nervousness and anxiety that can be increased by levothyroxine. She does not want to stop the levothyroxine though.   MEDICAL [address] [place], [place] - [address] [phone] (Phone) [phone] (Fax)

## 2018-08-15 NOTE — Telephone Encounter (Signed)
Called Pt No answer, left a VM to call office. Was calling Pt to give her a message from NP Leanora Cover Referral to psychiatry and psychology counseling were both placed on 07/31/2018.  Referral coordinator is working on getting his referral appointment set up.  I discussed with patient that I am not willing to prescribe her Klonopin only for control of anxiety because Klonopin is a benzodiazepine and is a controlled substance, these are not meant for chronic use to control anxiety..  I would be happy to try one of the SSRIs such as Lexapro, Celexa, Zoloft to see if they can help slightly with anxiety.  She has hydroxyzine to use if needed for breakthrough anxiety, I can renew this as well.  Okay for Pec to speak to Pt

## 2018-08-15 NOTE — Telephone Encounter (Signed)
I usually avoid clonidine because it can cause rebounding elevated blood pressures.  I would be willing to revisit the idea of trying a SSRI, I know she has tried many in the past but it is possible one can help her now

## 2018-08-15 NOTE — Telephone Encounter (Signed)
I know the different uses for clonidine, I know that it is used for BP control in some pateints. I do not like to use it because it can cause rebound high BP after taking it in some patient. I am not comfortable prescribing it after what happened to her BP with the Buspar that we recently tried.

## 2018-08-15 NOTE — Telephone Encounter (Signed)
Copied from CRM #[id number]. Topic: Quick Communication - Rx Refill/Question >> Aug 15, 2018  2:22 PM Crist Infante wrote: Medication: cloNIDine (CATAPRES) tablet 0.1 mg   Pt seen yesterday.  Pt states the [facility] psychiatrist put her on a waiting list. Pt would like to know if Lauren will fill this med for her until she can be seen. Pt states this med helped with her anxiety.  Pt states she has tried everything for anxiety.  MEDICAL [address] [place], [place] - [address] [phone] (Phone) [phone] (Fax)

## 2018-08-15 NOTE — Telephone Encounter (Signed)
Pt called Office I gave her the message that NP Leanora Cover stated about the Referral to psychiatry and psychology counseling were both placed on 07/31/2018.  Referral coordinator is working on getting her referral appointment set up. NP Leanora Cover also stated That she already discussed with patient that she is not willing to prescribe her Klonopin only for control of anxiety because Klonopin is a benzodiazepine and is a controlled substance, these are not meant for chronic use to control anxiety..  She also stated she would be happy to try one of the SSRIs such as Lexapro, Celexa, Zoloft to see if they can help slightly with anxiety.  Pt also has Rx Hydroxyzine to use if needed for breakthrough anxiety, and she can renew this as well.

## 2018-08-15 NOTE — Telephone Encounter (Signed)
Called Pt to give her the message from Leanora Cover FNP. Pt stated that she has tried about every medication that you can think of. Pt also stated that Leanora Cover FNP needs to do her research because Clonidine is for blood pressure.  Pt stated they wont have a appt for her in psych until April and that Lauren will not bend and give her the medication that she want.  Pt basically stated she tried everything, and she know what works for her.

## 2018-08-15 NOTE — Telephone Encounter (Signed)
Like Everetts said repeatedly, it can cause rebound high BP so it is not my favorite drug to use especially after she had elevated BP with the buspar.   I will do 2 month supply and that is all until she sees psychiatry. She really needs a specialist in the field of psychiatric medications.

## 2018-08-15 NOTE — Telephone Encounter (Signed)
Called Pt and told her that a Rx will be at her pharmacy for pick up but only a 2 month supply and she needs to see a psychiatris specialist after that. Pt said Thanks So much, with no questions.

## 2018-08-15 NOTE — Telephone Encounter (Signed)
Pt states she called the pharmacist and that the information she is receiving is conflicting. Pt wanting a 1 month refill on clonidine. [place] cannot see her until April/May with the psychiatrist.  Pharmacist told her that clonidine can be used for BP but also for anxiety and ADHD. Pt said she will not be using long term. Pharmacist, Dawn, said this is a great medication for anxiety since clonazepam will not be prescribed. Pt states she has nervousness and anxiety that can be increased by levothyroxine. She does not want to stop the levothyroxine though.   MEDICAL [address] [place], [place] - [address] [phone] (Phone) [phone] (Fax)

## 2018-08-18 ENCOUNTER — Emergency Department
Admission: EM | Admit: 2018-08-18 | Discharge: 2018-08-18 | Disposition: A | Discharge status: Home / Self Care | Payer: No Typology Code available for payment source | Attending: Emergency Medicine | Admitting: Emergency Medicine

## 2018-08-18 ENCOUNTER — Other Ambulatory Visit: Payer: Self-pay

## 2018-08-18 ENCOUNTER — Emergency Department: Payer: No Typology Code available for payment source

## 2018-08-18 DIAGNOSIS — F17210 Nicotine dependence, cigarettes, uncomplicated: Secondary | ICD-10-CM | POA: Insufficient documentation

## 2018-08-18 DIAGNOSIS — E039 Hypothyroidism, unspecified: Secondary | ICD-10-CM | POA: Insufficient documentation

## 2018-08-18 DIAGNOSIS — Z79899 Other long term (current) drug therapy: Secondary | ICD-10-CM | POA: Insufficient documentation

## 2018-08-18 DIAGNOSIS — M5412 Radiculopathy, cervical region: Secondary | ICD-10-CM

## 2018-08-18 LAB — CBC
HEMATOCRIT: 37.2 % (ref 36.0–46.0)
Hemoglobin: 12 g/dL (ref 12.0–15.0)
MCH: 28.6 pg (ref 26.0–34.0)
MCHC: 32.3 g/dL (ref 30.0–36.0)
MCV: 88.8 fL (ref 80.0–100.0)
Platelets: 298 10*3/uL (ref 150–400)
RBC: 4.19 MIL/uL (ref 3.87–5.11)
RDW: 13.1 % (ref 11.5–15.5)
WBC: 5.1 10*3/uL (ref 4.0–10.5)
nRBC: 0 % (ref 0.0–0.2)

## 2018-08-18 LAB — BASIC METABOLIC PANEL
Anion gap: 7 (ref 5–15)
BUN: 8 mg/dL (ref 6–20)
CO2: 23 mmol/L (ref 22–32)
Calcium: 8.4 mg/dL — ABNORMAL LOW (ref 8.9–10.3)
Chloride: 107 mmol/L (ref 98–111)
Creatinine, Ser: 0.81 mg/dL (ref 0.44–1.00)
GFR calc Af Amer: >60 mL/min (ref >60)
GFR calc non Af Amer: >60 mL/min (ref >60)
Glucose, Bld: 138 mg/dL — ABNORMAL HIGH (ref 70–99)
Potassium: 4 mmol/L (ref 3.5–5.1)
Sodium: 137 mmol/L (ref 135–145)

## 2018-08-18 MED ORDER — OXYCODONE HCL 5 MG PO TABS
5.0000 mg | ORAL_TABLET | ORAL | Status: AC
  Administered 2018-08-18: 5 mg via ORAL
  Filled 2018-08-18: qty 1

## 2018-08-18 MED ORDER — IBUPROFEN 600 MG PO TABS
600.0000 mg | ORAL_TABLET | ORAL | Status: AC
  Administered 2018-08-18: 600 mg via ORAL
  Filled 2018-08-18: qty 1

## 2018-08-18 MED ORDER — PREDNISONE 20 MG PO TABS
40.0000 mg | ORAL_TABLET | Freq: Once | ORAL | Status: AC
  Administered 2018-08-18: 40 mg via ORAL
  Filled 2018-08-18: qty 2

## 2018-08-18 MED ORDER — PREDNISONE 20 MG PO TABS: 40.0000 mg | ORAL_TABLET | Freq: Every day | ORAL | 0 refills | Status: DC

## 2018-08-18 NOTE — ED Notes (Signed)
Pt given juice

## 2018-08-18 NOTE — ED Notes (Signed)
Pt ambulated out in hall and requested pillow - gait was steady

## 2018-08-18 NOTE — ED Notes (Signed)
Pt on phone with MRI for screening 

## 2018-08-18 NOTE — ED Notes (Signed)
Pt reports that her neck pain has returned and she would like more pain medication

## 2018-08-18 NOTE — ED Provider Notes (Signed)
[facility] Emergency Department Provider Note   ____________________________________________   First MD Initiated Contact with Patient 08/18/18 1525     (approximate)  I have reviewed the triage vital signs and the nursing notes.   HISTORY  Chief Complaint Neck Pain and Tingling    HPI Destiny Myers is a 43 y.o. female here for evaluation of neck pain  Patient reports for about 2 years now she experienced pain intermittently in her right shoulder, sometimes a tingling in her hands.  In the last month she started having pain in her back of her neck.  Is been slowly increasing, reports that seem to get worse over the last several days, she notices when she wakes up the morning a tingling feeling in the middle of both hands and a radiating pain that will shoot from her middle neck down into her hands.  Seems to be getting worse, simply got worse a little bit more even today when she was at a store.  No nausea or vomiting.  No numbness or weakness in the face.  No trouble with her legs.  She reports she is able to feel her fingers well, in the morning still feel bit tingly, right now she has pain mostly located in the middle the neck.  Saw her orthopedist recently, they recommended that she follow-up with a spine surgeon.  Currently taking gabapentin prescribed by her physician as well for this pain she reports she start about a month ago   Past Medical History:  Diagnosis Date  . Anxiety   . Depression   . Hypothyroidism   . Kidney stones   . PTSD (post-traumatic stress disorder)   . UTI (urinary tract infection)     Patient Active Problem List   Diagnosis Date Noted  . History of suicide attempt 07/31/2018  . Anxiety 07/31/2018  . Neck pain 07/31/2018  . Tingling of upper extremity 07/31/2018  . Cocaine use disorder, moderate, dependence (HCC) 06/02/2016  . Self-inflicted laceration of wrist 06/02/2016  . Tobacco use disorder 06/17/2015  .  PTSD (post-traumatic stress disorder) 06/17/2015  . Severe recurrent major depression without psychotic features (HCC) 06/16/2015  . Opioid use disorder, mild, abuse (HCC) 06/16/2015  . Alcohol use disorder, mild, abuse 06/16/2015  . Suicide attempt (HCC) 06/16/2015  . Hypothyroidism 06/16/2015    Past Surgical History:  Procedure Laterality Date  . CESAREAN SECTION    . CHOLECYSTECTOMY    . CHOLECYSTECTOMY    . SHOULDER SURGERY Right     Prior to Admission medications   Medication Sig Start Date End Date Taking? Authorizing Provider  cloNIDine (CATAPRES) 0.1 MG tablet Take 1 tablet (0.1 mg total) by mouth 2 (two) times daily. 08/15/18  Yes Guse, Janna Arch, FNP  hydrOXYzine (ATARAX/VISTARIL) 10 MG tablet Take 1 tablet (10 mg total) by mouth 3 (three) times daily as needed for anxiety. 08/14/18  Yes Guse, Janna Arch, FNP  levothyroxine (SYNTHROID, LEVOTHROID) 25 MCG tablet Take 1 tablet (25 mcg total) by mouth daily before breakfast. 08/02/18  Yes Guse, Janna Arch, FNP  predniSONE (DELTASONE) 20 MG tablet Take 2 tablets (40 mg total) by mouth daily with breakfast. 08/18/18   Sharyn Creamer, MD  Vitamin D, Ergocalciferol, (DRISDOL) 1.25 MG (50000 UT) CAPS capsule Take 1 capsule (50,000 Units total) by mouth every 7 (seven) days. 08/02/18   Tracey Harries, FNP    Allergies Sulfa antibiotics; Tramadol; and Tylenol [acetaminophen]  Family History  Problem Relation Age of Onset  .  COPD Mother   . Depression Mother   . Early death Mother   . Hypertension Mother   . Mental illness Mother   . Heart disease Father   . Heart attack Father   . Hyperlipidemia Father   . Hypertension Father   . Breast cancer Maternal Grandmother   . Depression Maternal Grandmother   . Cancer Paternal Grandmother   . Birth defects Paternal Grandmother     Social History Social History   Tobacco Use  . Smoking status: Current Every Day Smoker    Packs/day: 0.50    Types: Cigarettes  . Smokeless tobacco: Never  Used  Substance Use Topics  . Alcohol use: Yes    Comment: socially  . Drug use: Not Currently    Comment: every now and then    Review of Systems Constitutional: No fever/chills Eyes: No visual changes. ENT: No sore throat. Cardiovascular: Denies chest pain. Respiratory: Denies shortness of breath. Gastrointestinal: No abdominal pain.   Genitourinary: Negative for dysuria. Musculoskeletal: Negative for back pain. Skin: Negative for rash. Neurological: Negative for headaches and denies any weakness in her muscles.  See HPI, tingling feeling in both hands but comes and goes worse in the mornings sometimes also made worse by use of her cell phone    ____________________________________________   PHYSICAL EXAM:  VITAL SIGNS: ED Triage Vitals [08/18/18 1355]  Enc Vitals Group     BP (!) 148/86     Pulse Rate (!) 101     Resp 20     Temp 98.7 F (37.1 C)     Temp Source Oral     SpO2 97 %     Weight 220 lb (99.8 kg)     Height  (1.626 m)     Head Circumference      Peak Flow      Pain Score 8     Pain Loc      Pain Edu?      Excl. in GC?     Constitutional: Alert and oriented. Well appearing and in no acute distress.  She is resting comfortably in no distress. Eyes: Conjunctivae are normal. Head: Atraumatic. Nose: No congestion/rhinnorhea. Mouth/Throat: Mucous membranes are moist. Neck: No stridor.  Cardiovascular: Normal rate, regular rhythm. Grossly normal heart sounds.  Good peripheral circulation. Respiratory: Normal respiratory effort.  No retractions. Lungs CTAB. Gastrointestinal: Soft and nontender. No distention. Musculoskeletal: No lower extremity tenderness nor edema.  5 out of 5 strength in all major extremities.  Normal use of the hands bilaterally  RIGHT Right upper extremity demonstrates normal strength, good use of all muscles. No edema bruising or contusions of the right shoulder/upper arm, right elbow, right forearm / hand. Full range of  motion of the right right upper extremity without pain. No evidence of trauma. Strong radial pulse. Intact median/ulnar/radial neuro-muscular exam.  LEFT Left upper extremity demonstrates normal strength, good use of all muscles. No edema bruising or contusions of the left shoulder/upper arm, left elbow, left forearm / hand. Full range of motion of the left  upper extremity without pain. No evidence of trauma. Strong radial pulse. Intact median/ulnar/radial neuro-muscular exam.   Neurologic:  Normal speech and language. No gross focal neurologic deficits are appreciated.  Skin:  Skin is warm, dry and intact. No rash noted. Psychiatric: Mood and affect are normal. Speech and behavior are normal.  ____________________________________________   LABS (all labs ordered are listed, but only abnormal results are displayed)  Labs Reviewed  BASIC  METABOLIC PANEL - Abnormal; Notable for the following components:      Result Value   Glucose, Bld 138 (*)    Calcium 8.4 (*)    All other components within normal limits  CBC   ____________________________________________  EKG   ____________________________________________  RADIOLOGY  MRI cervical spine reviewed by me:  IMPRESSION: Cervical disc and endplate degeneration C4-C5 through C6-C7. Borderline to mild spinal stenosis at those levels. Moderate to severe right C6, mild to moderate right C7, and mild right C5 and left C7 neural foraminal stenosis. ____________________________________________   PROCEDURES  Procedure(s) performed: None  Procedures  Critical Care performed: No  ____________________________________________   INITIAL IMPRESSION / ASSESSMENT AND PLAN / ED COURSE  Pertinent labs & imaging results that were available during my care of the patient were reviewed by me and considered in my medical decision making (see chart for details).   Patient seen for evaluation for neck pain.  She reports an increasing pain  in her neck over about a month.  Associated with a bilateral paresthesia in her radiating shooting pain.  No associated infectious symptoms.  No history of cancer.  Does have a history of previous shoulder issues.  No noted neurologic deficit at this time.  Cranial nerve exam normal median radial ulnar nerve exam normal.  Normal strength in lower extremities.  I did discuss with the patient, she reports she had plain films was told she has arthritis in her neck that is probably causing her pain.  Further discussed with her, given the increasing nature of her pain and severity of will obtain MRI of her cervical spine, patient is in agreement with this plan, wish to exclude mass, tumor, myelitis, herniated nucleus pulposus, etc.     ----------------------------------------- 8:14 PM on 08/18/2018 -----------------------------------------  Patient is resting comfortably.  Reports her pain is much better.  Reviewed her MRI results with her, and she has plan to see [facility] spine physician.  This appears to be appropriate follow-up for her, and the patient is in agreement with discharge and return precautions.  She is alert, her friend will be driving her home and she is not driving this evening.  Return precautions and treatment recommendations and follow-up discussed with the patient who is agreeable with the plan.    ____________________________________________   FINAL CLINICAL IMPRESSION(S) / ED DIAGNOSES  Final diagnoses:  Cervical radicular pain        Note:  This document was prepared using Dragon voice recognition software and may include unintentional dictation errors       Sharyn Creamer, MD 08/18/18 2017

## 2018-08-18 NOTE — ED Notes (Signed)
Patient transported to MRI 

## 2018-08-18 NOTE — ED Notes (Signed)
..  This tech in pt room at this time to check on pt, pt with no concerns at this time, pt with bed lowered, call light in reach, pt offered a warm blanket and accepted, pt not needing to use the restroom/be changed at this time, pt reminded that if a need arises the call light is within reach and to call for help. This tech will continue to monitor pt from monitors at nursing station and will continue to check in with pt hourly

## 2018-08-18 NOTE — ED Notes (Signed)
Pt reports that she saw PCP one month ago - xray was done and verified degenerative disc disease in neck - she has appt with [facility] spine clinic April 7th - no rx's received Shooting pain that radiates down bilat arms and into hand/fingers - sometimes it is tingling and numb Pt is reporting difficult to ambulate and severe pain in neck and entire spine region radiating from upper to lower  Pt states she uses Lidocaine patch, neurontin, aleve When entered room pt was sitting on side of bed - she had no difficulty turning around and sliding up in bed

## 2018-08-18 NOTE — ED Notes (Signed)
Pt reported neck pain now radiating to back

## 2018-08-18 NOTE — ED Triage Notes (Addendum)
Pt arrives to triage crying. States she has had this problem for a while. States when she wakes up arms are tingling. States neck and back pain now. Pt states when walking into gas station today it was painful to walk. A&O, in wheelchair. Pt states pain in neck/back began yesterday.   Pt states about a month ago she was dc with degenerative disc, has appt at spine clinic with [facility] in April. Was told numbness/tingling was coming from this at PCP.

## 2018-08-18 NOTE — Discharge Instructions (Signed)
No driving this evening.  Follow-up with [facility] Spine physician as you are already planning.

## 2018-08-22 ENCOUNTER — Ambulatory Visit: Payer: Self-pay

## 2018-08-24 ENCOUNTER — Emergency Department: Payer: No Typology Code available for payment source

## 2018-08-24 ENCOUNTER — Encounter: Payer: Self-pay | Admitting: Emergency Medicine

## 2018-08-24 ENCOUNTER — Other Ambulatory Visit: Payer: Self-pay

## 2018-08-24 ENCOUNTER — Emergency Department
Admission: EM | Admit: 2018-08-24 | Discharge: 2018-08-25 | Disposition: A | Discharge status: Home / Self Care | Payer: No Typology Code available for payment source | Attending: Emergency Medicine | Admitting: Emergency Medicine

## 2018-08-24 DIAGNOSIS — F1420 Cocaine dependence, uncomplicated: Secondary | ICD-10-CM | POA: Insufficient documentation

## 2018-08-24 DIAGNOSIS — N10 Acute tubulo-interstitial nephritis: Secondary | ICD-10-CM | POA: Insufficient documentation

## 2018-08-24 DIAGNOSIS — F419 Anxiety disorder, unspecified: Secondary | ICD-10-CM | POA: Insufficient documentation

## 2018-08-24 DIAGNOSIS — E039 Hypothyroidism, unspecified: Secondary | ICD-10-CM | POA: Insufficient documentation

## 2018-08-24 DIAGNOSIS — N12 Tubulo-interstitial nephritis, not specified as acute or chronic: Secondary | ICD-10-CM

## 2018-08-24 DIAGNOSIS — Z79899 Other long term (current) drug therapy: Secondary | ICD-10-CM | POA: Insufficient documentation

## 2018-08-24 DIAGNOSIS — F1110 Opioid abuse, uncomplicated: Secondary | ICD-10-CM | POA: Insufficient documentation

## 2018-08-24 DIAGNOSIS — R69 Illness, unspecified: Secondary | ICD-10-CM

## 2018-08-24 DIAGNOSIS — J111 Influenza due to unidentified influenza virus with other respiratory manifestations: Secondary | ICD-10-CM | POA: Insufficient documentation

## 2018-08-24 DIAGNOSIS — Z9049 Acquired absence of other specified parts of digestive tract: Secondary | ICD-10-CM | POA: Insufficient documentation

## 2018-08-24 DIAGNOSIS — F1310 Sedative, hypnotic or anxiolytic abuse, uncomplicated: Secondary | ICD-10-CM

## 2018-08-24 DIAGNOSIS — F1910 Other psychoactive substance abuse, uncomplicated: Secondary | ICD-10-CM | POA: Insufficient documentation

## 2018-08-24 DIAGNOSIS — R109 Unspecified abdominal pain: Secondary | ICD-10-CM | POA: Insufficient documentation

## 2018-08-24 DIAGNOSIS — F329 Major depressive disorder, single episode, unspecified: Secondary | ICD-10-CM | POA: Insufficient documentation

## 2018-08-24 DIAGNOSIS — F1010 Alcohol abuse, uncomplicated: Secondary | ICD-10-CM | POA: Insufficient documentation

## 2018-08-24 DIAGNOSIS — F17210 Nicotine dependence, cigarettes, uncomplicated: Secondary | ICD-10-CM | POA: Insufficient documentation

## 2018-08-24 DIAGNOSIS — R410 Disorientation, unspecified: Secondary | ICD-10-CM | POA: Insufficient documentation

## 2018-08-24 DIAGNOSIS — F4310 Post-traumatic stress disorder, unspecified: Secondary | ICD-10-CM | POA: Diagnosis present

## 2018-08-24 LAB — CBC WITH DIFFERENTIAL/PLATELET
Abs Immature Granulocytes: 0.09 10*3/uL — ABNORMAL HIGH (ref 0.00–0.07)
BASOS ABS: 0 10*3/uL (ref 0.0–0.1)
Basophils Relative: 0 %
Eosinophils Absolute: 0 10*3/uL (ref 0.0–0.5)
Eosinophils Relative: 0 %
HEMATOCRIT: 41.5 % (ref 36.0–46.0)
Hemoglobin: 13.6 g/dL (ref 12.0–15.0)
IMMATURE GRANULOCYTES: 1 %
Lymphocytes Relative: 5 %
Lymphs Abs: 0.7 10*3/uL (ref 0.7–4.0)
MCH: 28.6 pg (ref 26.0–34.0)
MCHC: 32.8 g/dL (ref 30.0–36.0)
MCV: 87.4 fL (ref 80.0–100.0)
Monocytes Absolute: 1.1 10*3/uL — ABNORMAL HIGH (ref 0.1–1.0)
Monocytes Relative: 7 %
Neutro Abs: 12.8 10*3/uL — ABNORMAL HIGH (ref 1.7–7.7)
Neutrophils Relative %: 87 %
Platelets: 284 10*3/uL (ref 150–400)
RBC: 4.75 MIL/uL (ref 3.87–5.11)
RDW: 13.8 % (ref 11.5–15.5)
WBC: 14.7 10*3/uL — ABNORMAL HIGH (ref 4.0–10.5)
nRBC: 0 % (ref 0.0–0.2)

## 2018-08-24 LAB — URINE DRUG SCREEN, QUALITATIVE (ARMC ONLY)
AMPHETAMINES, UR SCREEN: NOT DETECTED
BENZODIAZEPINE, UR SCRN: POSITIVE — AB
Barbiturates, Ur Screen: NOT DETECTED
Cannabinoid 50 Ng, Ur ~~LOC~~: POSITIVE — AB
Cocaine Metabolite,Ur ~~LOC~~: NOT DETECTED
MDMA (Ecstasy)Ur Screen: NOT DETECTED
Methadone Scn, Ur: NOT DETECTED
Opiate, Ur Screen: POSITIVE — AB
PHENCYCLIDINE (PCP) UR S: NOT DETECTED
Tricyclic, Ur Screen: NOT DETECTED

## 2018-08-24 LAB — URINALYSIS, COMPLETE (UACMP) WITH MICROSCOPIC
Bacteria, UA: NONE SEEN
Bilirubin Urine: NEGATIVE
Glucose, UA: NEGATIVE mg/dL
Ketones, ur: 5 mg/dL — AB
Nitrite: NEGATIVE
Protein, ur: 30 mg/dL — AB
Specific Gravity, Urine: 1.017 (ref 1.005–1.030)
WBC, UA: >50 WBC/hpf — ABNORMAL HIGH (ref 0–5)
pH: 6 (ref 5.0–8.0)

## 2018-08-24 LAB — LIPASE, BLOOD: Lipase: 20 U/L (ref 11–51)

## 2018-08-24 LAB — INFLUENZA PANEL BY PCR (TYPE A & B)
Influenza A By PCR: NEGATIVE
Influenza B By PCR: NEGATIVE

## 2018-08-24 LAB — COMPREHENSIVE METABOLIC PANEL
ALT: 22 U/L (ref 0–44)
AST: 28 U/L (ref 15–41)
Albumin: 3.7 g/dL (ref 3.5–5.0)
Alkaline Phosphatase: 85 U/L (ref 38–126)
Anion gap: 12 (ref 5–15)
BUN: 12 mg/dL (ref 6–20)
CO2: 17 mmol/L — ABNORMAL LOW (ref 22–32)
Calcium: 8.5 mg/dL — ABNORMAL LOW (ref 8.9–10.3)
Chloride: 103 mmol/L (ref 98–111)
Creatinine, Ser: 0.91 mg/dL (ref 0.44–1.00)
GFR calc non Af Amer: >60 mL/min (ref >60)
Glucose, Bld: 170 mg/dL — ABNORMAL HIGH (ref 70–99)
Potassium: 3.4 mmol/L — ABNORMAL LOW (ref 3.5–5.1)
Sodium: 132 mmol/L — ABNORMAL LOW (ref 135–145)
TOTAL PROTEIN: 7.5 g/dL (ref 6.5–8.1)
Total Bilirubin: 0.6 mg/dL (ref 0.3–1.2)

## 2018-08-24 LAB — POCT PREGNANCY, URINE: Preg Test, Ur: NEGATIVE

## 2018-08-24 MED ORDER — IOPAMIDOL (ISOVUE-300) INJECTION 61%
30.0000 mL | Freq: Once | INTRAVENOUS | Status: AC | PRN
  Administered 2018-08-24: 30 mL via ORAL

## 2018-08-24 MED ORDER — SODIUM CHLORIDE 0.9 % IV SOLN
1.0000 g | Freq: Once | INTRAVENOUS | Status: AC
  Administered 2018-08-24: 1 g via INTRAVENOUS
  Filled 2018-08-24: qty 10

## 2018-08-24 MED ORDER — SODIUM CHLORIDE 0.9 % IV BOLUS
1000.0000 mL | Freq: Once | INTRAVENOUS | Status: AC
  Administered 2018-08-24: 1000 mL via INTRAVENOUS

## 2018-08-24 MED ORDER — ONDANSETRON 4 MG PO TBDP
4.0000 mg | ORAL_TABLET | Freq: Once | ORAL | Status: AC
  Administered 2018-08-24: 4 mg via ORAL
  Filled 2018-08-24 (×2): qty 1

## 2018-08-24 MED ORDER — CEPHALEXIN 500 MG PO CAPS
500.0000 mg | ORAL_CAPSULE | Freq: Three times a day (TID) | ORAL | Status: DC
  Administered 2018-08-25 (×2): 500 mg via ORAL
  Filled 2018-08-24 (×3): qty 1

## 2018-08-24 MED ORDER — HALOPERIDOL LACTATE 5 MG/ML IJ SOLN
5.0000 mg | Freq: Once | INTRAMUSCULAR | Status: AC
  Administered 2018-08-24: 5 mg via INTRAMUSCULAR
  Filled 2018-08-24: qty 1

## 2018-08-24 NOTE — ED Notes (Signed)
Hourly rounding reveals patient sleeping in room. No complaints, stable, in no acute distress. Q15 minute rounds and monitoring via Security Cameras to continue. 

## 2018-08-24 NOTE — ED Triage Notes (Addendum)
Says not better from last visit.  Says she hasnt been eating or drinking.  Fever 100 per ems.  Body aches. Cough.  Ems says pt slow to answer questions and stutters.  This is second visit, but did not have fever last time.

## 2018-08-24 NOTE — ED Notes (Signed)
Pt has night gown placed in patient belonging's bag and in BHU storage.

## 2018-08-24 NOTE — ED Notes (Signed)
Conducting hourly rounding and checking to see if pt was able to urinate, entered room to find large pool of clear fluid and blood on the floor. All leads and monitors were disconnected. Her L hand IV had been pulled out and was laying on the steel tray table next to her bed. Upon entering the room she stated to this nurse "im sorry, I spilled my water". When asked if it was urine, she repeated "my water". Dr Roxan Hockey notified.

## 2018-08-24 NOTE — ED Notes (Signed)
Patient transported to CT 

## 2018-08-24 NOTE — ED Notes (Signed)
Pt. Transferred to BHU from ED to room 3 after screening for contraband. Report to include Situation, Background, Assessment and Recommendations from [facility]. Pt. Oriented to unit including Q15 minute rounds as well as the security cameras for their protection. Patient is alert and oriented, warm and dry in no acute distress. Patient denies SI, HI, and AVH. Pt. Encouraged to let me know if needs arise.

## 2018-08-24 NOTE — ED Notes (Signed)
Pt. Up to rest room to vomit bile.

## 2018-08-24 NOTE — ED Provider Notes (Signed)
[facility] Emergency Department Provider Note   ____________________________________________   First MD Initiated Contact with Patient 08/24/18 1340     (approximate)  I have reviewed the triage vital signs and the nursing notes.   HISTORY  Chief Complaint Generalized Body Aches and Anorexia   HPI Destiny Myers is a 43 y.o. female patient reports she has not been eating or drinking for the last couple days.  She says her chest hurts.  She has a sore throat and a dry cough and a fever and her belly aches.  She seems to be slightly out of it.  She can answer all my questions accurately but could not tell the nurse her correct birth date.  She does seem to be kind of spaced out.  She does have a history of anxiety and depression.  She has posttraumatic stress disorder.  Of note is the fact that the patient has no bra on and is dressed in a complete see-through black laced top.         Past Medical History:  Diagnosis Date  . Anxiety   . Depression   . Hypothyroidism   . Kidney stones   . PTSD (post-traumatic stress disorder)   . UTI (urinary tract infection)     Patient Active Problem List   Diagnosis Date Noted  . History of suicide attempt 07/31/2018  . Anxiety 07/31/2018  . Neck pain 07/31/2018  . Tingling of upper extremity 07/31/2018  . Cocaine use disorder, moderate, dependence (HCC) 06/02/2016  . Self-inflicted laceration of wrist 06/02/2016  . Tobacco use disorder 06/17/2015  . PTSD (post-traumatic stress disorder) 06/17/2015  . Severe recurrent major depression without psychotic features (HCC) 06/16/2015  . Opioid use disorder, mild, abuse (HCC) 06/16/2015  . Alcohol use disorder, mild, abuse 06/16/2015  . Suicide attempt (HCC) 06/16/2015  . Hypothyroidism 06/16/2015    Past Surgical History:  Procedure Laterality Date  . CESAREAN SECTION    . CHOLECYSTECTOMY    . CHOLECYSTECTOMY    . SHOULDER SURGERY Right     Prior to  Admission medications   Medication Sig Start Date End Date Taking? Authorizing Provider  cloNIDine (CATAPRES) 0.1 MG tablet Take 1 tablet (0.1 mg total) by mouth 2 (two) times daily. 08/15/18   Tracey Harries, FNP  hydrOXYzine (ATARAX/VISTARIL) 10 MG tablet Take 1 tablet (10 mg total) by mouth 3 (three) times daily as needed for anxiety. 08/14/18   Tracey Harries, FNP  levothyroxine (SYNTHROID, LEVOTHROID) 25 MCG tablet Take 1 tablet (25 mcg total) by mouth daily before breakfast. 08/02/18   Guse, Janna Arch, FNP  predniSONE (DELTASONE) 20 MG tablet Take 2 tablets (40 mg total) by mouth daily with breakfast. 08/18/18   Sharyn Creamer, MD  Vitamin D, Ergocalciferol, (DRISDOL) 1.25 MG (50000 UT) CAPS capsule Take 1 capsule (50,000 Units total) by mouth every 7 (seven) days. 08/02/18   Tracey Harries, FNP    Allergies Sulfa antibiotics; Tramadol; and Tylenol [acetaminophen]  Family History  Problem Relation Age of Onset  . COPD Mother   . Depression Mother   . Early death Mother   . Hypertension Mother   . Mental illness Mother   . Heart disease Father   . Heart attack Father   . Hyperlipidemia Father   . Hypertension Father   . Breast cancer Maternal Grandmother   . Depression Maternal Grandmother   . Cancer Paternal Grandmother   . Birth defects Paternal Grandmother  Social History Social History   Tobacco Use  . Smoking status: Current Every Day Smoker    Packs/day: 0.50    Types: Cigarettes  . Smokeless tobacco: Never Used  Substance Use Topics  . Alcohol use: Yes    Comment: socially  . Drug use: Not Currently    Comment: every now and then    Review of Systems  Constitutional:  fever/chills Eyes: No visual changes. ENT: No sore throat. Cardiovascular:  chest pain. Respiratory: Denies shortness of breath. Gastrointestinal:  abdominal pain.  No nausea, no vomiting.  No diarrhea.  No constipation. Genitourinary: Negative for dysuria. Musculoskeletal: Negative for back  pain. Skin: Negative for rash. Neurological: Negative for headaches, focal weakness    ____________________________________________   PHYSICAL EXAM:  VITAL SIGNS: ED Triage Vitals  Enc Vitals Group     BP 08/24/18 1352 131/67     Pulse Rate 08/24/18 1352 (!) 102     Resp 08/24/18 1352 16     Temp 08/24/18 1352 98.9 F (37.2 C)     Temp Source 08/24/18 1352 Oral     SpO2 08/24/18 1352 97 %     Weight 08/24/18 1255 219 lb 12.8 oz (99.7 kg)     Height 08/24/18 1255 5\' 4"  (1.626 m)     Head Circumference --      Peak Flow --      Pain Score 08/24/18 1254 8     Pain Loc --      Pain Edu? --      Excl. in GC? --     Constitutional: Alert and oriented. Well appearing and in no acute distress. Eyes: Conjunctivae are normal. Head: Atraumatic. Nose: No congestion/rhinnorhea. Mouth/Throat: Mucous membranes are moist.  Oropharynx non-erythematous. Neck: No stridor.   Cardiovascular: Normal rate, regular rhythm. Grossly normal heart sounds.  Good peripheral circulation. Respiratory: Normal respiratory effort.  No retractions. Lungs CTAB. Gastrointestinal: Soft and diffusely tender.  Tenderness does not seem to be severe. No distention. No abdominal bruits. No CVA tenderness. Musculoskeletal: No lower extremity tenderness nor edema.   Neurologic: Slightly slow speech the patient has to think before she talks to. No gross focal neurologic deficits are appreciated. Skin:  Skin is warm, dry and intact. No rash noted.   ____________________________________________   LABS (all labs ordered are listed, but only abnormal results are displayed)  Labs Reviewed  COMPREHENSIVE METABOLIC PANEL - Abnormal; Notable for the following components:      Result Value   Sodium 132 (*)    Potassium 3.4 (*)    CO2 17 (*)    Glucose, Bld 170 (*)    Calcium 8.5 (*)    All other components within normal limits  CBC WITH DIFFERENTIAL/PLATELET - Abnormal; Notable for the following components:   WBC  14.7 (*)    Neutro Abs 12.8 (*)    Monocytes Absolute 1.1 (*)    Abs Immature Granulocytes 0.09 (*)    All other components within normal limits  INFLUENZA PANEL BY PCR (TYPE A & B)  URINALYSIS, COMPLETE (UACMP) WITH MICROSCOPIC  URINALYSIS, COMPLETE (UACMP) WITH MICROSCOPIC  URINE DRUG SCREEN, QUALITATIVE ([facility] ONLY)  LIPASE, BLOOD  POC URINE PREG, ED   ____________________________________________  EKG  EKG read and interpreted by me shows sinus tachycardia rate of 101 normal axis no acute ST-T changes there is slight flipping of the T waves in V2 and flattening in V3 ____________________________________________  RADIOLOGY  ED MD interpretation: Chest x-ray read by radiology reviewed  by me shows no acute disease  Official radiology report(s): Dg Chest 2 View  Result Date: 08/24/2018 CLINICAL DATA:  Cough and fever with chest pain EXAM: CHEST - 2 VIEW COMPARISON:  None. FINDINGS: Lungs are clear. Heart size and pulmonary vascularity are normal. No adenopathy. No pneumothorax. There is postoperative change in the right shoulder. IMPRESSION: No edema or consolidation. Electronically Signed   By: Bretta Bang III M.D.   On: 08/24/2018 14:18    ____________________________________________   PROCEDURES  Procedure(s) performed (including Critical Care):  Procedures   ____________________________________________   INITIAL IMPRESSION / ASSESSMENT AND PLAN / ED COURSE   Pt's cts are pending. Pt signed out to Dr Roxan Hockey. We may need to admit this patient. Currently she worries me as she does not seem to be thinking entirely normally. She is not confused just slower thn I would expect.              ____________________________________________   FINAL CLINICAL IMPRESSION(S) / ED DIAGNOSES  Final diagnoses:  Influenza-like illness     ED Discharge Orders    None       Note:  This document was prepared using Dragon voice recognition software and may  include unintentional dictation errors.    Arnaldo Natal, MD 08/24/18 [phone]

## 2018-08-24 NOTE — ED Provider Notes (Addendum)
Patient agitated uncooperative with staff pulling out IV.  Does not seem organic at this time.  We will continue with CT imaging to exclude abdominal pain she was reporting earlier but she does not have any abdominal pain right now.  She is refusing to give any urine.  I have a high suspicion for substance induced pathology.  ----------------------------------------- 7:21 PM on 08/24/2018 -----------------------------------------  UDS is positive for benzos opiates and cannabinoids.  Do not feel the patient needs CT imaging of the chest.  She has no hypoxia or tachycardia.  Based on her complaints of abdominal pain we will continue with CT abdomen.  If negative will consult psychiatry.   ----------------------------------------- 8:19 PM on 08/24/2018 -----------------------------------------  Urinalysis does show pyuria given CT imaging concerning for pyelonephritis will give dose of Rocephin.  She remains afebrile.  Possibly had a recently passed stone but there is no evidence of acute obstruction at this time.  She remains hemodynamically stable.  Will give Rocephin and then started on oral antibiotics.   Willy Eddy, MD 08/24/18 2019

## 2018-08-24 NOTE — ED Notes (Signed)
ED Provider at bedside. 

## 2018-08-24 NOTE — ED Notes (Signed)
Entered room to meet patient after handoff to find her returning to bed from the bathroom. The patient had removed BP cuff, O2 sensor and pulled all leads off her chest. There was a bottle of CT contrast spilled all over the floor. The patients R hand was covered with blood. She had pulled out her IV while in the bathroom. Call light was attached to pts bedside but she had not activated it. She stated "I'm sorry, I had an accident" and indicated the bottle on the floor. After ensuring the patient was safely back in bed, this nurse looked in the bathroom and found blood on the floor in front of the commode and the urine had upended into the toilet. When asked what happened, the patient simply stated "i'm sorry". All leads reattached, pt given 2nd bottle of contrast to drink. Given instruction on call bell use and re stated the need for a urine sample. Pt indicated understanding. Pt resting comfortably.

## 2018-08-24 NOTE — ED Notes (Signed)
Pt aware of needed urine specimen but states unable to obtain

## 2018-08-25 ENCOUNTER — Emergency Department: Payer: No Typology Code available for payment source

## 2018-08-25 DIAGNOSIS — R410 Disorientation, unspecified: Secondary | ICD-10-CM

## 2018-08-25 DIAGNOSIS — F1310 Sedative, hypnotic or anxiolytic abuse, uncomplicated: Secondary | ICD-10-CM

## 2018-08-25 LAB — GASTROINTESTINAL PANEL BY PCR, STOOL (REPLACES STOOL CULTURE)

## 2018-08-25 LAB — CBC WITH DIFFERENTIAL/PLATELET
Abs Immature Granulocytes: 0.07 10*3/uL (ref 0.00–0.07)
Basophils Absolute: 0 10*3/uL (ref 0.0–0.1)
Basophils Relative: 0 %
Eosinophils Absolute: 0 10*3/uL (ref 0.0–0.5)
Eosinophils Relative: 0 %
HEMATOCRIT: 43 % (ref 36.0–46.0)
HEMOGLOBIN: 14.6 g/dL (ref 12.0–15.0)
Immature Granulocytes: 1 %
LYMPHS PCT: 8 %
Lymphs Abs: 0.9 10*3/uL (ref 0.7–4.0)
MCH: 28.5 pg (ref 26.0–34.0)
MCHC: 34 g/dL (ref 30.0–36.0)
MCV: 83.8 fL (ref 80.0–100.0)
Monocytes Absolute: 1.1 10*3/uL — ABNORMAL HIGH (ref 0.1–1.0)
Monocytes Relative: 9 %
Neutro Abs: 10.2 10*3/uL — ABNORMAL HIGH (ref 1.7–7.7)
Neutrophils Relative %: 82 %
Platelets: 312 10*3/uL (ref 150–400)
RBC: 5.13 MIL/uL — ABNORMAL HIGH (ref 3.87–5.11)
RDW: 13.8 % (ref 11.5–15.5)
WBC: 12.4 10*3/uL — ABNORMAL HIGH (ref 4.0–10.5)
nRBC: 0 % (ref 0.0–0.2)

## 2018-08-25 LAB — COMPREHENSIVE METABOLIC PANEL
ALBUMIN: 3.8 g/dL (ref 3.5–5.0)
ALT: 20 U/L (ref 0–44)
AST: 16 U/L (ref 15–41)
Alkaline Phosphatase: 77 U/L (ref 38–126)
Anion gap: 14 (ref 5–15)
BUN: 19 mg/dL (ref 6–20)
CO2: 21 mmol/L — ABNORMAL LOW (ref 22–32)
Calcium: 9.2 mg/dL (ref 8.9–10.3)
Chloride: 101 mmol/L (ref 98–111)
Creatinine, Ser: 0.97 mg/dL (ref 0.44–1.00)
GFR calc Af Amer: >60 mL/min (ref >60)
GFR calc non Af Amer: >60 mL/min (ref >60)
GLUCOSE: 162 mg/dL — AB (ref 70–99)
Potassium: 3 mmol/L — ABNORMAL LOW (ref 3.5–5.1)
Sodium: 136 mmol/L (ref 135–145)
Total Bilirubin: 0.5 mg/dL (ref 0.3–1.2)
Total Protein: 8.5 g/dL — ABNORMAL HIGH (ref 6.5–8.1)

## 2018-08-25 LAB — C DIFFICILE QUICK SCREEN W PCR REFLEX
C Diff antigen: NEGATIVE
C Diff interpretation: NOT DETECTED
C Diff toxin: NEGATIVE

## 2018-08-25 MED ORDER — ONDANSETRON HCL 4 MG/2ML IJ SOLN
4.0000 mg | Freq: Once | INTRAMUSCULAR | Status: AC
  Administered 2018-08-25: 4 mg via INTRAVENOUS
  Filled 2018-08-25: qty 2

## 2018-08-25 MED ORDER — SODIUM CHLORIDE 0.9 % IV BOLUS
1000.0000 mL | Freq: Once | INTRAVENOUS | Status: AC
  Administered 2018-08-25: 1000 mL via INTRAVENOUS

## 2018-08-25 MED ORDER — CEPHALEXIN 500 MG PO CAPS: 500.0000 mg | ORAL_CAPSULE | Freq: Four times a day (QID) | ORAL | 0 refills | Status: DC

## 2018-08-25 MED ORDER — IOHEXOL 350 MG/ML SOLN
75.0000 mL | Freq: Once | INTRAVENOUS | Status: AC | PRN
  Administered 2018-08-25: 75 mL via INTRAVENOUS

## 2018-08-25 NOTE — ED Notes (Signed)
Pt with a call from her sister  Marcelino Duster  [phone]    IVF infusing complete  Pt verbalized  "I am feeling better"   Continue to monitor

## 2018-08-25 NOTE — ED Notes (Signed)
Pt with an observed visit from her sister at this time - observed by [employer] tech

## 2018-08-25 NOTE — Discharge Instructions (Signed)
Patient plenty of fluid to stay well-hydrated.  You may use Tylenol or Motrin for pain or fever.  Please do not take any illicit or prescription drugs that are not prescribed to you.  Return to the emergency department for severe pain, lightheadedness or fainting, fever, inability to keep down fluids, or any other symptoms concerning to you.

## 2018-08-25 NOTE — ED Notes (Signed)
Pt awake - lying in bed  Pt observed with no unusual behavior  Appropriate to stimulation  No verbalized needs or concerns at this time  NAD assessed  Continue to monitor

## 2018-08-25 NOTE — ED Notes (Signed)
Pt returned from CT   IVF to be initiated   Pt verbalizing nausea

## 2018-08-25 NOTE — ED Notes (Signed)
Taking patient to CT at this time with officer Nunzio Cory.

## 2018-08-25 NOTE — ED Provider Notes (Signed)
This patient was signed out to me by Dr. Marge Duncans.  43 year old female who is urine drug screen showed that she was intoxicated with multiple substances presenting with some altered mental status, diarrhea.  Her work-up is consistent with pyelonephritis and she has received Rocephin and Keflex here, and she will continue a 10-day course of Keflex.  I have reevaluated the patient myself, who is hemodynamically stable and afebrile.  She is ambulatory, has been eating and drinking without any difficulty.  She has a normal speech pattern and is not exhibiting any confusion at this time.  She is safe for discharge home.  Follow-up instructions as well as return precautions were discussed.   Rockne Menghini, MD 08/25/18 [phone]

## 2018-08-25 NOTE — ED Notes (Signed)

## 2018-08-25 NOTE — ED Notes (Signed)
IV site extravasation redness improved  Edema improved

## 2018-08-25 NOTE — ED Notes (Signed)
Right upper extremity remains elevated    Edema continues to improve attempted to educate her the importance of keeping her arm elevated for a few hours today   Continue to monitor

## 2018-08-25 NOTE — ED Notes (Signed)
Hourly rounding reveals patient in room. No complaints, stable, in no acute distress. Q15 minute rounds and monitoring via Rover and Officer to continue.   

## 2018-08-25 NOTE — ED Notes (Signed)
Back from CT at this time.

## 2018-08-25 NOTE — ED Notes (Signed)
ED  Is the patient under IVC or is there intent for IVC: Yes.   Is the patient medically cleared: Yes.   Is there vacancy in the ED BHU: Yes.   Is the population mix appropriate for patient: Yes.   Is the patient awaiting placement in inpatient or outpatient setting:  Has the patient had a psychiatric consult:  Awaiting consult Survey of unit performed for contraband, proper placement and condition of furniture, tampering with fixtures in bathroom, shower, and each patient room: Yes.  ; Findings:  APPEARANCE/BEHAVIOR Calm and cooperative NEURO ASSESSMENT Orientation: oriented x3   Hallucinations: No.None noted (Hallucinations)  Denies  Speech: Normal Gait: normal RESPIRATORY ASSESSMENT Even  Unlabored respirations  CARDIOVASCULAR ASSESSMENT Pulses equal   regular rate  Skin warm and dry   GASTROINTESTINAL ASSESSMENT no GI complaint EXTREMITIES Full ROM  PLAN OF CARE Provide calm/safe environment. Vital signs assessed twice daily. ED BHU Assessment once each 12-hour shift. Collaborate with TTS daily or as condition indicates. Assure the ED provider has rounded once each shift. Provide and encourage hygiene. Provide redirection as needed. Assess for escalating behavior; address immediately and inform ED provider.  Assess family dynamic and appropriateness for visitation as needed: Yes.  ; If necessary, describe findings:  Educate the patient/family about BHU procedures/visitation: Yes.  ; If necessary, describe findings:

## 2018-08-25 NOTE — ED Provider Notes (Signed)
Patient continues to complain of chest and abdominal pain.  She says she is having diarrhea now.  She is a more clear in her thinking today.  No coughing but a little bit of shortness of breath she says.  Repeat lab work get a stool specimen since she still complaining of the chest pain with breathing I'll get the CT of her chest..   Arnaldo Natal, MD 08/25/18 [phone]

## 2018-08-25 NOTE — ED Notes (Signed)
Patient back from CT at this time

## 2018-08-25 NOTE — ED Notes (Signed)
Pt returned from CT scan.

## 2018-08-25 NOTE — ED Notes (Signed)
BEHAVIORAL HEALTH ROUNDING Patient sleeping: No. Patient alert and oriented: yes Behavior appropriate: Yes.  ; If no, describe:  Nutrition and fluids offered: yes Toileting and hygiene offered: Yes  Sitter present: q15 minute observations and security  monitoring Law enforcement present: Yes  ODS  

## 2018-08-25 NOTE — ED Notes (Addendum)
She is currently in CT scan  - enteric precautions signage placed on door way of room - gloves and gown outside of her room  Blood specimens sent to lab  Awaiting stool specimen  Pt alert to self, place  - disorientation to situation and time   No orthostasis  BP elevated     Linens changed

## 2018-08-25 NOTE — ED Notes (Signed)
Dr.malinda in room talking with patient at this time.

## 2018-08-25 NOTE — ED Notes (Signed)
Ice kept on site of extravasation to right arm for approx 50 minutes   Edema improved

## 2018-08-25 NOTE — ED Notes (Signed)
Ice remains on IV extravasation site   No redness seen  Edema present

## 2018-08-25 NOTE — Consult Note (Signed)
[facility] Face-to-Face Psychiatry Consult   Reason for Consult: Consult for this 43 year old woman with a history of substance abuse who came into the hospital with confusion and fever Referring Physician: Juliette Alcide Patient Identification: Destiny Myers MRN:  [id number] Principal Diagnosis: Delirium Diagnosis:  Principal Problem:   Delirium Active Problems:   Opiate abuse, episodic (HCC)   PTSD (post-traumatic stress disorder)   Benzodiazepine abuse (HCC)   Total Time spent with patient: 1 hour  Subjective:   Destiny Myers is a 43 y.o. female patient admitted with "I was running a fever".  HPI: Patient seen chart reviewed.  This is a 43 year old woman who came to the emergency room apparently confused somewhat agitated complaining of feeling sick and having a fever.  It appears that her boyfriend was having trouble getting her to come to the hospital and thought that her confusion at the time was remarkable and therefore used commitment powers possibly to get her brought to the hospital.  On evaluation today the patient is lucid clear and cooperative.  She tells me that she has been feeling sick for 2 or 3 days.  Had felt like she was running a fever.  Had not been eating well.  Had been having some diarrhea.  She also admits to me that while she does take Klonopin on a prescription she also has been taking occasional doses of morphine and other benzodiazepines which she is not prescribed.  Patient absolutely denies having been depressed recently.  Denies having had any suicidal thoughts.  Denies that she overdosed on anything.  Denies any homicidal ideation.  Denies that she has been having any hallucinations.  She admits that she was feeling confused earlier and blames this on being sick and having a fever.  Social history: Patient lives with her boyfriend.  Not currently receiving mental health treatment although she says she is going to start seeing someone in [place] soon.  Medical  history: Patient right now appears to have urinary tract infection and is getting a further work-up for her fever.  No other ongoing medical problems.  Substance abuse history: Extensive history of abuse of multiple substances including cocaine benzodiazepines and opiates.  Past Psychiatric History: Patient has had previous psychiatric hospitalization and has had suicide attempts in the past most recently 2 or 3 years ago.  She has primarily needed treatment in the context of substance intoxication.  Currently not receiving any outpatient treatment.  No history of psychotic disorder  Risk to Self:   Risk to Others:   Prior Inpatient Therapy:   Prior Outpatient Therapy:    Past Medical History:  Past Medical History:  Diagnosis Date  . Anxiety   . Depression   . Hypothyroidism   . Kidney stones   . PTSD (post-traumatic stress disorder)   . UTI (urinary tract infection)     Past Surgical History:  Procedure Laterality Date  . CESAREAN SECTION    . CHOLECYSTECTOMY    . CHOLECYSTECTOMY    . SHOULDER SURGERY Right    Family History:  Family History  Problem Relation Age of Onset  . COPD Mother   . Depression Mother   . Early death Mother   . Hypertension Mother   . Mental illness Mother   . Heart disease Father   . Heart attack Father   . Hyperlipidemia Father   . Hypertension Father   . Breast cancer Maternal Grandmother   . Depression Maternal Grandmother   . Cancer Paternal Grandmother   .  Birth defects Paternal Grandmother    Family Psychiatric  History: None known Social History:  Social History   Substance and Sexual Activity  Alcohol Use Yes   Comment: socially     Social History   Substance and Sexual Activity  Drug Use Not Currently   Comment: every now and then    Social History   Socioeconomic History  . Marital status: Divorced    Spouse name: Not on file  . Number of children: 2  . Years of education: Not on file  . Highest education level:  Some college, no degree  Occupational History  . Not on file  Social Needs  . Financial resource strain: Not on file  . Food insecurity:    Worry: Not on file    Inability: Not on file  . Transportation needs:    Medical: Yes    Non-medical: Yes  Tobacco Use  . Smoking status: Current Every Day Smoker    Packs/day: 0.50    Types: Cigarettes  . Smokeless tobacco: Never Used  Substance and Sexual Activity  . Alcohol use: Yes    Comment: socially  . Drug use: Not Currently    Comment: every now and then  . Sexual activity: Yes    Birth control/protection: None  Lifestyle  . Physical activity:    Days per week: Not on file    Minutes per session: Not on file  . Stress: Not on file  Relationships  . Social connections:    Talks on phone: Not on file    Gets together: Not on file    Attends religious service: Not on file    Active member of club or organization: Not on file    Attends meetings of clubs or organizations: Not on file    Relationship status: Not on file  Other Topics Concern  . Not on file  Social History Narrative  . Not on file   Additional Social History:    Allergies:   Allergies  Allergen Reactions  . Sulfa Antibiotics Nausea And Vomiting  . Tramadol Swelling  . Tylenol [Acetaminophen] Nausea And Vomiting    Labs:  Results for orders placed or performed during the hospital encounter of 08/24/18 (from the past 48 hour(s))  Comprehensive metabolic panel     Status: Abnormal   Collection Time: 08/24/18  1:56 PM  Result Value Ref Range   Sodium 132 (L) 135 - 145 mmol/L   Potassium 3.4 (L) 3.5 - 5.1 mmol/L   Chloride 103 98 - 111 mmol/L   CO2 17 (L) 22 - 32 mmol/L   Glucose, Bld 170 (H) 70 - 99 mg/dL   BUN 12 6 - 20 mg/dL   Creatinine, Ser 1.61 0.44 - 1.00 mg/dL   Calcium 8.5 (L) 8.9 - 10.3 mg/dL   Total Protein 7.5 6.5 - 8.1 g/dL   Albumin 3.7 3.5 - 5.0 g/dL   AST 28 15 - 41 U/L   ALT 22 0 - 44 U/L   Alkaline Phosphatase 85 38 - 126 U/L    Total Bilirubin 0.6 0.3 - 1.2 mg/dL   GFR calc non Af Amer >60 >60 mL/min   GFR calc Af Amer >60 >60 mL/min   Anion gap 12 5 - 15    Comment: Performed at [facility], [address] [place], [place] [address]  Influenza panel by PCR (type A & B)     Status: None   Collection Time: 08/24/18  1:56 PM  Result Value Ref Range   Influenza A By PCR NEGATIVE NEGATIVE   Influenza B By PCR NEGATIVE NEGATIVE    Comment: (NOTE) The Xpert Xpress Flu assay is intended as an aid in the diagnosis of  influenza and should not be used as a sole basis for treatment.  This  assay is FDA approved for nasopharyngeal swab specimens only. Nasal  washings and aspirates are unacceptable for Xpert Xpress Flu testing. Performed at [facility], [address] [place], [place] [address]   CBC with Differential/Platelet     Status: Abnormal   Collection Time: 08/24/18  1:56 PM  Result Value Ref Range   WBC 14.7 (H) 4.0 - 10.5 K/uL   RBC 4.75 3.87 - 5.11 MIL/uL   Hemoglobin 13.6 12.0 - 15.0 g/dL   HCT 27.5 17.0 - 01.7 %   MCV 87.4 80.0 - 100.0 fL   MCH 28.6 26.0 - 34.0 pg   MCHC 32.8 30.0 - 36.0 g/dL   RDW 49.4 49.6 - 75.9 %   Platelets 284 150 - 400 K/uL   nRBC 0.0 0.0 - 0.2 %   Neutrophils Relative % 87 %   Neutro Abs 12.8 (H) 1.7 - 7.7 K/uL   Lymphocytes Relative 5 %   Lymphs Abs 0.7 0.7 - 4.0 K/uL   Monocytes Relative 7 %   Monocytes Absolute 1.1 (H) 0.1 - 1.0 K/uL   Eosinophils Relative 0 %   Eosinophils Absolute 0.0 0.0 - 0.5 K/uL   Basophils Relative 0 %   Basophils Absolute 0.0 0.0 - 0.1 K/uL   Immature Granulocytes 1 %   Abs Immature Granulocytes 0.09 (H) 0.00 - 0.07 K/uL    Comment: Performed at [facility], [address] [place], [place] [address]  Lipase, blood     Status: None   Collection Time: 08/24/18  1:56 PM  Result Value Ref Range   Lipase 20 11 - 51 U/L    Comment: Performed at [facility], [address] [place], [place]  [address]  Urinalysis, Complete w Microscopic     Status: Abnormal   Collection Time: 08/24/18  6:32 PM  Result Value Ref Range   Color, Urine YELLOW (A) YELLOW   APPearance HAZY (A) CLEAR   Specific Gravity, Urine 1.017 1.005 - 1.030   pH 6.0 5.0 - 8.0   Glucose, UA NEGATIVE NEGATIVE mg/dL   Hgb urine dipstick MODERATE (A) NEGATIVE   Bilirubin Urine NEGATIVE NEGATIVE   Ketones, ur 5 (A) NEGATIVE mg/dL   Protein, ur 30 (A) NEGATIVE mg/dL   Nitrite NEGATIVE NEGATIVE   Leukocytes,Ua SMALL (A) NEGATIVE   RBC / HPF 0-5 0 - 5 RBC/hpf   WBC, UA >50 (H) 0 - 5 WBC/hpf   Bacteria, UA NONE SEEN NONE SEEN   Squamous Epithelial / LPF 0-5 0 - 5   Mucus PRESENT    Hyaline Casts, UA PRESENT     Comment: Performed at [facility], [address], [place], [place] [address]  Urine Drug Screen, Qualitative     Status: Abnormal   Collection Time: 08/24/18  6:32 PM  Result Value Ref Range   Tricyclic, Ur Screen NONE DETECTED NONE DETECTED   Amphetamines, Ur Screen NONE DETECTED NONE DETECTED   MDMA (Ecstasy)Ur Screen NONE DETECTED NONE DETECTED   Cocaine Metabolite,Ur  NONE DETECTED NONE DETECTED   Opiate, Ur Screen POSITIVE (A) NONE DETECTED   Phencyclidine (PCP) Ur S NONE DETECTED NONE DETECTED   Cannabinoid  50 Ng, Ur [place] POSITIVE (A) NONE DETECTED   Barbiturates, Ur Screen NONE DETECTED NONE DETECTED   Benzodiazepine, Ur Scrn POSITIVE (A) NONE DETECTED   Methadone Scn, Ur NONE DETECTED NONE DETECTED    Comment: (NOTE) Tricyclics + metabolites, urine    Cutoff 1000 ng/mL Amphetamines + metabolites, urine  Cutoff 1000 ng/mL MDMA (Ecstasy), urine              Cutoff 500 ng/mL Cocaine Metabolite, urine          Cutoff 300 ng/mL Opiate + metabolites, urine        Cutoff 300 ng/mL Phencyclidine (PCP), urine         Cutoff 25 ng/mL Cannabinoid, urine                 Cutoff 50 ng/mL Barbiturates + metabolites, urine  Cutoff 200 ng/mL Benzodiazepine, urine              Cutoff 200  ng/mL Methadone, urine                   Cutoff 300 ng/mL The urine drug screen provides only a preliminary, unconfirmed analytical test result and should not be used for non-medical purposes. Clinical consideration and professional judgment should be applied to any positive drug screen result due to possible interfering substances. A more specific alternate chemical method must be used in order to obtain a confirmed analytical result. Gas chromatography / mass spectrometry (GC/MS) is the preferred confirmat ory method. Performed at [facility], [address] [place], [place] [address]   Pregnancy, urine POC     Status: None   Collection Time: 08/24/18  6:35 PM  Result Value Ref Range   Preg Test, Ur NEGATIVE NEGATIVE    Comment:        THE SENSITIVITY OF THIS METHODOLOGY IS >24 mIU/mL   Comprehensive metabolic panel     Status: Abnormal   Collection Time: 08/25/18  7:31 AM  Result Value Ref Range   Sodium 136 135 - 145 mmol/L   Potassium 3.0 (L) 3.5 - 5.1 mmol/L   Chloride 101 98 - 111 mmol/L   CO2 21 (L) 22 - 32 mmol/L   Glucose, Bld 162 (H) 70 - 99 mg/dL   BUN 19 6 - 20 mg/dL   Creatinine, Ser 9.56 0.44 - 1.00 mg/dL   Calcium 9.2 8.9 - 21.3 mg/dL   Total Protein 8.5 (H) 6.5 - 8.1 g/dL   Albumin 3.8 3.5 - 5.0 g/dL   AST 16 15 - 41 U/L   ALT 20 0 - 44 U/L   Alkaline Phosphatase 77 38 - 126 U/L   Total Bilirubin 0.5 0.3 - 1.2 mg/dL   GFR calc non Af Amer >60 >60 mL/min   GFR calc Af Amer >60 >60 mL/min   Anion gap 14 5 - 15    Comment: Performed at [facility], [address] [place], [place] [address]  CBC with Differential     Status: Abnormal   Collection Time: 08/25/18  7:31 AM  Result Value Ref Range   WBC 12.4 (H) 4.0 - 10.5 K/uL   RBC 5.13 (H) 3.87 - 5.11 MIL/uL   Hemoglobin 14.6 12.0 - 15.0 g/dL   HCT 84.6 96.2 - 95.2 %   MCV 83.8 80.0 - 100.0 fL   MCH 28.5 26.0 - 34.0 pg   MCHC 34.0 30.0 - 36.0 g/dL   RDW 84.1 32.4 - 40.1 %  Platelets 312 150 - 400 K/uL   nRBC 0.0 0.0 - 0.2 %   Neutrophils Relative % 82 %   Neutro Abs 10.2 (H) 1.7 - 7.7 K/uL   Lymphocytes Relative 8 %   Lymphs Abs 0.9 0.7 - 4.0 K/uL   Monocytes Relative 9 %   Monocytes Absolute 1.1 (H) 0.1 - 1.0 K/uL   Eosinophils Relative 0 %   Eosinophils Absolute 0.0 0.0 - 0.5 K/uL   Basophils Relative 0 %   Basophils Absolute 0.0 0.0 - 0.1 K/uL   Immature Granulocytes 1 %   Abs Immature Granulocytes 0.07 0.00 - 0.07 K/uL    Comment: Performed at [facility], [address] [place], [place] [address]  Gastrointestinal Panel by PCR , Stool     Status: None   Collection Time: 08/25/18  2:31 PM  Result Value Ref Range   Campylobacter species NOT DETECTED NOT DETECTED   Plesimonas shigelloides NOT DETECTED NOT DETECTED   Salmonella species NOT DETECTED NOT DETECTED   Yersinia enterocolitica NOT DETECTED NOT DETECTED   Vibrio species NOT DETECTED NOT DETECTED   Vibrio cholerae NOT DETECTED NOT DETECTED   Enteroaggregative E coli (EAEC) NOT DETECTED NOT DETECTED   Enteropathogenic E coli (EPEC) NOT DETECTED NOT DETECTED   Enterotoxigenic E coli (ETEC) NOT DETECTED NOT DETECTED   Shiga like toxin producing E coli (STEC) NOT DETECTED NOT DETECTED   Shigella/Enteroinvasive E coli (EIEC) NOT DETECTED NOT DETECTED   Cryptosporidium NOT DETECTED NOT DETECTED   Cyclospora cayetanensis NOT DETECTED NOT DETECTED   Entamoeba histolytica NOT DETECTED NOT DETECTED   Giardia lamblia NOT DETECTED NOT DETECTED   Adenovirus F40/41 NOT DETECTED NOT DETECTED   Astrovirus NOT DETECTED NOT DETECTED   Norovirus GI/GII NOT DETECTED NOT DETECTED   Rotavirus A NOT DETECTED NOT DETECTED   Sapovirus (I, II, IV, and V) NOT DETECTED NOT DETECTED    Comment: Performed at [facility], [address] [place], [place] [address]  C difficile quick scan w PCR reflex     Status: None   Collection Time: 08/25/18  2:31 PM  Result Value Ref Range   C Diff  antigen NEGATIVE NEGATIVE   C Diff toxin NEGATIVE NEGATIVE   C Diff interpretation No C. difficile detected.     Comment: Performed at [facility], [address] [place], [place] [address]    Current Facility-Administered Medications  Medication Dose Route Frequency Provider Last Rate Last Dose  . cephALEXin (KEFLEX) capsule 500 mg  500 mg Oral Q8H Willy Eddy, MD   500 mg at 08/25/18 1447   Current Outpatient Medications  Medication Sig Dispense Refill  . busPIRone (BUSPAR) 10 MG tablet Take 10 mg by mouth 2 (two) times daily.    . cloNIDine (CATAPRES) 0.1 MG tablet Take 1 tablet (0.1 mg total) by mouth 2 (two) times daily. 60 tablet 1  . levothyroxine (SYNTHROID, LEVOTHROID) 25 MCG tablet Take 1 tablet (25 mcg total) by mouth daily before breakfast. 90 tablet 1  . Vitamin D, Ergocalciferol, (DRISDOL) 1.25 MG (50000 UT) CAPS capsule Take 1 capsule (50,000 Units total) by mouth every 7 (seven) days. 12 capsule 0  . hydrOXYzine (ATARAX/VISTARIL) 10 MG tablet Take 1 tablet (10 mg total) by mouth 3 (three) times daily as needed for anxiety. (Patient not taking: Reported on 08/25/2018) 30 tablet 2  . predniSONE (DELTASONE) 20 MG tablet Take 2 tablets (40 mg total) by mouth daily with breakfast. (Patient not taking: Reported  on 08/25/2018) 8 tablet 0    Musculoskeletal: Strength & Muscle Tone: within normal limits Gait & Station: normal Patient leans: N/A  Psychiatric Specialty Exam: Physical Exam  Nursing note and vitals reviewed. Constitutional: She appears well-developed and well-nourished.  HENT:  Head: Normocephalic and atraumatic.  Eyes: Pupils are equal, round, and reactive to light. Conjunctivae are normal.  Neck: Normal range of motion.  Cardiovascular: Regular rhythm and normal heart sounds.  Respiratory: Effort normal. No respiratory distress.  GI: Soft.  Musculoskeletal: Normal range of motion.  Neurological: She is alert.  Skin: Skin is warm and dry.   Psychiatric: She has a normal mood and affect. Her speech is normal and behavior is normal. Judgment and thought content normal. She exhibits abnormal recent memory.    Review of Systems  Constitutional: Positive for fever and malaise/fatigue.  HENT: Negative.   Eyes: Negative.   Respiratory: Negative.   Cardiovascular: Negative.   Gastrointestinal: Negative.   Musculoskeletal: Negative.   Skin: Negative.   Neurological: Negative.   Psychiatric/Behavioral: Positive for memory loss and substance abuse. Negative for depression, hallucinations and suicidal ideas. The patient has insomnia. The patient is not nervous/anxious.     Blood pressure (!) 146/93, pulse 90, temperature 98.8 F (37.1 C), temperature source Oral, resp. rate 17, height 5\' 4"  (1.626 m), weight 99.7 kg, last menstrual period 08/11/2018, SpO2 100 %.Body mass index is 37.73 kg/m.  General Appearance: Casual  Eye Contact:  Good  Speech:  Slow  Volume:  Normal  Mood:  Dysphoric  Affect:  Appropriate  Thought Process:  Coherent  Orientation:  Full (Time, Place, and Person)  Thought Content:  Logical  Suicidal Thoughts:  No  Homicidal Thoughts:  No  Memory:  Immediate;   Fair Recent;   Fair Remote;   Fair  Judgement:  Fair  Insight:  Fair  Psychomotor Activity:  Normal  Concentration:  Concentration: Fair  Recall:  [employer] of Knowledge:  Fair  Language:  Fair  Akathisia:  No  Handed:  Right  AIMS (if indicated):     Assets:  Communication Skills Desire for Improvement Housing Social Support  ADL's:  Intact  Cognition:  WNL  Sleep:        Treatment Plan Summary: Plan Patient seen chart reviewed.  She has been in the emergency room for many hours now.  When she first came in it appears that she was confused and was probably exhibiting delirium from a combination of her illness (most likely a severe urinary tract infection) and from intoxication on opiates and benzodiazepines.  She has evidently cleared  up quite a bit.  She is still a little sluggish but she is alert and oriented x4.  Shows good insight and judgment.  Not showing signs of any hallucinations or delusions.  No evidence of suicidal or homicidal ideation.  Cooperative with medical treatment.  Patient does not meet commitment criteria and does not require inpatient psychiatric hospitalization.  Spent some time doing psychoeducation about how the abuse of benzodiazepines and opiates can worsen delirium and even worse could potentially lead to fatal outcomes.  Strongly encouraged her to stop this.  Patient says she understands and she is working on it.  Case reviewed with Dr. Juliette Alcide and with Dr. Sharma Covert.  Discontinue IVC.  Disposition: No evidence of imminent risk to self or others at present.   Recommend psychiatric Inpatient admission when medically cleared. Supportive therapy provided about ongoing stressors. Discussed crisis plan, support from social network,  calling 911, coming to the Emergency Department, and calling Suicide Hotline.  Mordecai Rasmussen, MD 08/25/2018 4:18 PM

## 2018-08-25 NOTE — ED Notes (Signed)
Pt. Transferred to ED  from [facility] to room 23 after having nausea and multiple emesis.  Report received from [employer]. Patient is alert and oriented, warm and dry in no acute distress. Patient denies SI, HI, and AVH. Pt. Encouraged to let me know if needs arise.
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
## 2018-08-25 NOTE — ED Notes (Signed)
Pt. Up to rest room to vomit. Dr. Manson Passey and Flow Coordinator consulted.

## 2018-08-25 NOTE — ED Notes (Signed)
Ice applied to extravasation to right ac IV site

## 2018-08-25 NOTE — ED Notes (Signed)
Hourly rounding reveals patient sleeping in room. No complaints, stable, in no acute distress. Q15 minute rounds and monitoring via Security Cameras to continue. 

## 2018-08-25 NOTE — ED Notes (Signed)
She is coughing  - dry non productive  She coughs so much and so hard that she has now vomited

## 2018-08-25 NOTE — ED Notes (Signed)
Taking patient back to CT WITH OFFICER lYONS AT THIS TIME.

## 2018-08-25 NOTE — ED Notes (Signed)
pts right arm with the extravasation remains elevated  - ice removed  Pt to be admitted to inpt psychiatric treatment  EDP waiting for stool sample and results from tests before she can be medically cleared for admission   Pt educated about the need for the sample and she verbalized agreement and understanding   IVC discussed also  Continue to monitor

## 2018-08-25 NOTE — ED Notes (Signed)
Pt to CT scan.

## 2018-08-25 NOTE — BH Assessment (Signed)
Assessment Note  Destiny Myers is an 43 y.o. female who presents to the ED with c/o not feeling better from last visit.  Says she hasnt been eating or drinking.  Fever 100 per ems.  Body aches. Cough.  Ems says pt slow to answer questions and stutters.  This is second visit, but did not have fever last time.  During the assessment the pt met this Probation officer with bazaar behaviors. Pts speech was slow and thought blocking. When asked pt's date of birth she reports "June 25th 2013". Pt continues to answer each question asked with "Ummmmm 2011, ummmm 2011". Pt doesn't make eye contact or complete sentences.  Pt unable to deny or confirm SI/HI  A/V H/D.   Diagnosis: Altered Mental Status  Past Medical History:  Past Medical History:  Diagnosis Date  . Anxiety   . Depression   . Hypothyroidism   . Kidney stones   . PTSD (post-traumatic stress disorder)   . UTI (urinary tract infection)     Past Surgical History:  Procedure Laterality Date  . CESAREAN SECTION    . CHOLECYSTECTOMY    . CHOLECYSTECTOMY    . SHOULDER SURGERY Right     Family History:  Family History  Problem Relation Age of Onset  . COPD Mother   . Depression Mother   . Early death Mother   . Hypertension Mother   . Mental illness Mother   . Heart disease Father   . Heart attack Father   . Hyperlipidemia Father   . Hypertension Father   . Breast cancer Maternal Grandmother   . Depression Maternal Grandmother   . Cancer Paternal Grandmother   . Birth defects Paternal Grandmother     Social History:  reports that she has been smoking cigarettes. She has been smoking about 0.50 packs per day. She has never used smokeless tobacco. She reports current alcohol use. She reports previous drug use.  Additional Social History:     CIWA: CIWA-Ar BP: (!) 143/95 Pulse Rate: (!) 110(MD Robinson aware) COWS:    Allergies:  Allergies  Allergen Reactions  . Sulfa Antibiotics Nausea And Vomiting  . Tramadol Swelling   . Tylenol [Acetaminophen] Nausea And Vomiting    Home Medications: (Not in a hospital admission)   OB/GYN Status:  Patient's last menstrual period was 08/11/2018.  General Assessment Data Assessment unable to be completed: Yes Reason for not completing assessment: Altered Mental Status. Pt either unable or unwilling to answer assessment questions.  Admission Status: Involuntary                    Mental Status Report Motor Activity: Unremarkable                            Advance Directives (For Healthcare) Does Patient Have a Medical Advance Directive?: No          Disposition:     On Site Evaluation by:   Reviewed with Physician:    Lyan Moyano D Alvaretta Eisenberger 08/25/2018 2:07 AM

## 2018-08-25 NOTE — ED Notes (Signed)
Pt. Transported to room 23.

## 2018-08-25 NOTE — ED Provider Notes (Signed)
Patient C. difficile and stool PCR negative.  She is does have a UTI.  If she is mentally clear we will plan on discharging her.  Dr. Sharma Covert will take a look at her and makes a decision.  She is not mentally clear we will continue to watch her for a while.  She is slowly clearing.   Arnaldo Natal, MD 08/25/18 [phone]

## 2018-08-25 NOTE — ED Notes (Signed)
Ice reapplied to right arm IV extravasation site  - site continues to be without edema but ice applied for extra precaution

## 2018-08-25 NOTE — ED Notes (Signed)
She is currently taking a shower  

## 2018-08-30 ENCOUNTER — Telehealth: Payer: Self-pay

## 2018-08-30 NOTE — Telephone Encounter (Signed)
Copied from CRM #[id number]. Topic: General - Other °>> Aug 30, 2018  3:41 PM Walter, Linda F wrote: °Reason for CRM:   Patient went to [facility] for a Kidney Infection on 08/24/2018.  She was given Cephalexin but she is having side effects that include, vomiting, nausea and diarrhea.  She would like to be prescribed something else if possible.  Please advise. Please call [phone] °

## 2018-08-30 NOTE — Telephone Encounter (Signed)
Called and spoke with pt. Pt stated that she has NO fever, NO blood in vomit, some abdominal pain but tolerable, NO blood in stool as of now did have dark blood in stool while at the ER but is currently on her cycle now, sharp pelvic pain that has now went away, cold chills, lower back pain. Pt stated that she feels that her symptoms are somewhat getting better but not 100%.   Per PCP he felt it would be in the pt's best interest to be evaluated today at Urgent care in [facility] since they will have access to ED OV notes and do any further testing that may need to be done.   Pt advised and voiced understanding and planned to go to urgent care will check in with patient tomorrow.

## 2018-08-30 NOTE — Telephone Encounter (Signed)
Please contact the patient and get more details. Is she having any additional symptoms? Is she having abdominal pain? Blood in her stool? Fevers? Blood in her vomit?

## 2018-08-30 NOTE — Telephone Encounter (Deleted)
Copied from CRM [id number]. Topic: General - Other >> Aug 30, 2018  3:41 PM Trula Slade wrote: Reason for CRM:   Patient went to [facility] ER for a Kidney Infection on 08/24/2018.  She was given Cephalexin but she is having side effects that include, vomiting, nausea and diarrhea.  She would like to be prescribed something else if possible.  Please advise. Please call [phone]

## 2018-08-31 ENCOUNTER — Other Ambulatory Visit: Payer: Self-pay

## 2018-08-31 ENCOUNTER — Encounter: Payer: Self-pay | Admitting: Emergency Medicine

## 2018-08-31 ENCOUNTER — Emergency Department
Admission: EM | Admit: 2018-08-31 | Discharge: 2018-08-31 | Disposition: A | Discharge status: Home / Self Care | Payer: No Typology Code available for payment source | Attending: Emergency Medicine | Admitting: Emergency Medicine

## 2018-08-31 DIAGNOSIS — Z79899 Other long term (current) drug therapy: Secondary | ICD-10-CM | POA: Insufficient documentation

## 2018-08-31 DIAGNOSIS — F17210 Nicotine dependence, cigarettes, uncomplicated: Secondary | ICD-10-CM | POA: Insufficient documentation

## 2018-08-31 DIAGNOSIS — N390 Urinary tract infection, site not specified: Secondary | ICD-10-CM | POA: Insufficient documentation

## 2018-08-31 DIAGNOSIS — E039 Hypothyroidism, unspecified: Secondary | ICD-10-CM | POA: Insufficient documentation

## 2018-08-31 DIAGNOSIS — Z881 Allergy status to other antibiotic agents status: Secondary | ICD-10-CM | POA: Insufficient documentation

## 2018-08-31 LAB — URINALYSIS, COMPLETE (UACMP) WITH MICROSCOPIC
Bilirubin Urine: NEGATIVE
Glucose, UA: NEGATIVE mg/dL
Ketones, ur: NEGATIVE mg/dL
Nitrite: NEGATIVE
PH: 6 (ref 5.0–8.0)
Protein, ur: NEGATIVE mg/dL
Specific Gravity, Urine: 1.016 (ref 1.005–1.030)

## 2018-08-31 LAB — CBC WITH DIFFERENTIAL/PLATELET
ABS IMMATURE GRANULOCYTES: 0.06 10*3/uL (ref 0.00–0.07)
Basophils Absolute: 0 10*3/uL (ref 0.0–0.1)
Basophils Relative: 1 %
Eosinophils Absolute: 0.2 10*3/uL (ref 0.0–0.5)
Eosinophils Relative: 2 %
HEMATOCRIT: 39 % (ref 36.0–46.0)
Hemoglobin: 12.6 g/dL (ref 12.0–15.0)
Immature Granulocytes: 1 %
Lymphocytes Relative: 39 %
Lymphs Abs: 3.2 10*3/uL (ref 0.7–4.0)
MCH: 28.7 pg (ref 26.0–34.0)
MCHC: 32.3 g/dL (ref 30.0–36.0)
MCV: 88.8 fL (ref 80.0–100.0)
Monocytes Absolute: 0.4 10*3/uL (ref 0.1–1.0)
Monocytes Relative: 5 %
NEUTROS ABS: 4.4 10*3/uL (ref 1.7–7.7)
Neutrophils Relative %: 52 %
Platelets: 441 10*3/uL — ABNORMAL HIGH (ref 150–400)
RBC: 4.39 MIL/uL (ref 3.87–5.11)
RDW: 13.5 % (ref 11.5–15.5)
WBC: 8.2 10*3/uL (ref 4.0–10.5)
nRBC: 0 % (ref 0.0–0.2)

## 2018-08-31 LAB — COMPREHENSIVE METABOLIC PANEL
ALT: 18 U/L (ref 0–44)
AST: 25 U/L (ref 15–41)
Albumin: 3.4 g/dL — ABNORMAL LOW (ref 3.5–5.0)
Alkaline Phosphatase: 70 U/L (ref 38–126)
Anion gap: 10 (ref 5–15)
BILIRUBIN TOTAL: 0.3 mg/dL (ref 0.3–1.2)
BUN: 11 mg/dL (ref 6–20)
CHLORIDE: 105 mmol/L (ref 98–111)
CO2: 23 mmol/L (ref 22–32)
Calcium: 8.6 mg/dL — ABNORMAL LOW (ref 8.9–10.3)
Creatinine, Ser: 1.04 mg/dL — ABNORMAL HIGH (ref 0.44–1.00)
GFR calc Af Amer: >60 mL/min (ref >60)
GFR calc non Af Amer: >60 mL/min (ref >60)
Glucose, Bld: 118 mg/dL — ABNORMAL HIGH (ref 70–99)
Potassium: 3.7 mmol/L (ref 3.5–5.1)
Sodium: 138 mmol/L (ref 135–145)
Total Protein: 6.9 g/dL (ref 6.5–8.1)

## 2018-08-31 LAB — LIPASE, BLOOD: Lipase: 42 U/L (ref 11–51)

## 2018-08-31 MED ORDER — ONDANSETRON 4 MG PO TBDP: 4.0000 mg | ORAL_TABLET | Freq: Four times a day (QID) | ORAL | 0 refills | Status: DC | PRN

## 2018-08-31 MED ORDER — AMOXICILLIN-POT CLAVULANATE 875-125 MG PO TABS
1.0000 | ORAL_TABLET | Freq: Once | ORAL | Status: AC
  Administered 2018-08-31: 1 via ORAL
  Filled 2018-08-31: qty 1

## 2018-08-31 MED ORDER — ONDANSETRON 4 MG PO TBDP
4.0000 mg | ORAL_TABLET | Freq: Once | ORAL | Status: AC
  Administered 2018-08-31: 4 mg via ORAL
  Filled 2018-08-31: qty 1

## 2018-08-31 MED ORDER — ONDANSETRON HCL 4 MG/2ML IJ SOLN
4.0000 mg | Freq: Once | INTRAMUSCULAR | Status: DC
  Filled 2018-08-31: qty 2

## 2018-08-31 MED ORDER — AMOXICILLIN-POT CLAVULANATE 500-125 MG PO TABS: 1.0000 | ORAL_TABLET | Freq: Two times a day (BID) | ORAL | 0 refills | Status: AC

## 2018-08-31 MED ORDER — PROMETHAZINE HCL 25 MG PO TABS
25.0000 mg | ORAL_TABLET | Freq: Once | ORAL | Status: AC
  Administered 2018-08-31: 25 mg via ORAL
  Filled 2018-08-31: qty 1

## 2018-08-31 MED ORDER — SODIUM CHLORIDE 0.9 % IV BOLUS
1000.0000 mL | Freq: Once | INTRAVENOUS | Status: AC
  Administered 2018-08-31: 1000 mL via INTRAVENOUS

## 2018-08-31 NOTE — ED Provider Notes (Signed)
[facility] Emergency Department Provider Note ____________________________________________   First MD Initiated Contact with Patient 08/31/18 1102     (approximate)  I have reviewed the triage vital signs and the nursing notes.   HISTORY  Chief Complaint Back Pain and Abdominal Pain   HPI Destiny Myers is a 43 y.o. female here for evaluation because of ongoing pain and discomfort with urination  Patient reports diagnosed recent with urinary tract infection, on cephalexin.  However she is noticed when she is been trying to take it that is caused her to vomit within about 30 minutes of taking it each time she is used it over the last few days.  Causes her to feel nauseated, she will vomit and then she has a loose stool.  She is vomited total about 7 times which she describes as yellow or acid with no blood.  She is not having any more fevers or chills.  She is feeling overall improved but reports she cannot seem to keep the antibiotic down as it keeps causing her to vomit when she takes it.  No chest pain or shortness of breath.  Reports her neck pain is actually better and not getting better after about a month.  She is not in any significant pain, only painful like a sharp discomfort when she tries to urinate for the last several days.  Still eating and drinking, but reports she just cannot take this antibiotic without feeling sick.  Not pregnant.   Past Medical History:  Diagnosis Date  . Anxiety   . Depression   . Hypothyroidism   . Kidney stones   . PTSD (post-traumatic stress disorder)   . UTI (urinary tract infection)     Patient Active Problem List   Diagnosis Date Noted  . Delirium 08/25/2018  . Benzodiazepine abuse (HCC) 08/25/2018  . History of suicide attempt 07/31/2018  . Anxiety 07/31/2018  . Neck pain 07/31/2018  . Tingling of upper extremity 07/31/2018  . Cocaine use disorder, moderate, dependence (HCC) 06/02/2016  .  Self-inflicted laceration of wrist 06/02/2016  . Tobacco use disorder 06/17/2015  . PTSD (post-traumatic stress disorder) 06/17/2015  . Severe recurrent major depression without psychotic features (HCC) 06/16/2015  . Opiate abuse, episodic (HCC) 06/16/2015  . Alcohol use disorder, mild, abuse 06/16/2015  . Suicide attempt (HCC) 06/16/2015  . Hypothyroidism 06/16/2015    Past Surgical History:  Procedure Laterality Date  . CESAREAN SECTION    . CHOLECYSTECTOMY    . CHOLECYSTECTOMY    . SHOULDER SURGERY Right     Prior to Admission medications   Medication Sig Start Date End Date Taking? Authorizing Provider  amoxicillin-clavulanate (AUGMENTIN) 500-125 MG tablet Take 1 tablet (500 mg total) by mouth 2 (two) times daily for 5 days. 08/31/18 09/05/18  Sharyn Creamer, MD  busPIRone (BUSPAR) 10 MG tablet Take 10 mg by mouth 2 (two) times daily.    [provider]  cloNIDine (CATAPRES) 0.1 MG tablet Take 1 tablet (0.1 mg total) by mouth 2 (two) times daily. 08/15/18   Tracey Harries, FNP  hydrOXYzine (ATARAX/VISTARIL) 10 MG tablet Take 1 tablet (10 mg total) by mouth 3 (three) times daily as needed for anxiety. Patient not taking: Reported on 08/25/2018 08/14/18   Tracey Harries, FNP  levothyroxine (SYNTHROID, LEVOTHROID) 25 MCG tablet Take 1 tablet (25 mcg total) by mouth daily before breakfast. 08/02/18   Guse, Janna Arch, FNP  ondansetron (ZOFRAN ODT) 4 MG disintegrating tablet Take 1 tablet (  4 mg total) by mouth every 6 (six) hours as needed for nausea or vomiting. 08/31/18   Sharyn Creamer, MD  predniSONE (DELTASONE) 20 MG tablet Take 2 tablets (40 mg total) by mouth daily with breakfast. Patient not taking: Reported on 08/25/2018 08/18/18   Sharyn Creamer, MD  Vitamin D, Ergocalciferol, (DRISDOL) 1.25 MG (50000 UT) CAPS capsule Take 1 capsule (50,000 Units total) by mouth every 7 (seven) days. 08/02/18   Tracey Harries, FNP    Allergies Sulfa antibiotics; Tramadol; and Tylenol [acetaminophen]   Family History  Problem Relation Age of Onset  . COPD Mother   . Depression Mother   . Early death Mother   . Hypertension Mother   . Mental illness Mother   . Heart disease Father   . Heart attack Father   . Hyperlipidemia Father   . Hypertension Father   . Breast cancer Maternal Grandmother   . Depression Maternal Grandmother   . Cancer Paternal Grandmother   . Birth defects Paternal Grandmother     Social History Social History   Tobacco Use  . Smoking status: Current Every Day Smoker    Packs/day: 0.50    Types: Cigarettes  . Smokeless tobacco: Never Used  Substance Use Topics  . Alcohol use: Yes    Comment: socially  . Drug use: Not Currently    Comment: every now and then    Review of Systems Constitutional: No fever/chills and overall strength feels better Eyes: No visual changes. ENT: No sore throat. Cardiovascular: Denies chest pain. Respiratory: Denies shortness of breath. Gastrointestinal: No abdominal pain.  However gets nauseated and vomits whenever she tries to take her antibiotics cephalexin Genitourinary: Some dysuria sharp discomfort when she urinates.  No vaginal discharge or discomfort. Musculoskeletal: Negative for back pain. Skin: Negative for rash. Neurological: Negative for headaches, areas of focal weakness or numbness.    ____________________________________________   PHYSICAL EXAM:  VITAL SIGNS: ED Triage Vitals  Enc Vitals Group     BP 08/31/18 1038 (!) 127/91     Pulse Rate 08/31/18 1038 73     Resp 08/31/18 1038 16     Temp 08/31/18 1038 98.2 F (36.8 C)     Temp Source 08/31/18 1038 Oral     SpO2 08/31/18 1038 97 %     Weight 08/31/18 1024 219 lb 12.8 oz (99.7 kg)     Height 08/31/18 1024  (1.626 m)     Head Circumference --      Peak Flow --      Pain Score 08/31/18 1023 7     Pain Loc --      Pain Edu? --      Excl. in GC? --     Constitutional: Alert and oriented. Well appearing and in no acute distress.   She is very pleasant. Eyes: Conjunctivae are normal. Head: Atraumatic. Nose: No congestion/rhinnorhea. Mouth/Throat: Mucous membranes are moist. Neck: No stridor.  Cardiovascular: Normal rate, regular rhythm. Grossly normal heart sounds.  Good peripheral circulation. Respiratory: Normal respiratory effort.  No retractions. Lungs CTAB. Gastrointestinal: Soft and nontender for some slight discomfort suprapubically without any rebound or guarding in any quadrant. No distention. Musculoskeletal: No lower extremity tenderness nor edema. Neurologic:  Normal speech and language. No gross focal neurologic deficits are appreciated.  Skin:  Skin is warm, dry and intact. No rash noted. Psychiatric: Mood and affect are normal. Speech and behavior are normal.  Overall the patient is fully alert, nontoxic and well-appearing.  Currently denies being in pain, reports it only hurts when she urinates primarily.  She also tells me that she has taken amoxicillin in the past and tolerated this well  ____________________________________________   LABS (all labs ordered are listed, but only abnormal results are displayed)  Labs Reviewed  URINALYSIS, COMPLETE (UACMP) WITH MICROSCOPIC - Abnormal; Notable for the following components:      Result Value   Color, Urine YELLOW (*)    APPearance HAZY (*)    Hgb urine dipstick SMALL (*)    Leukocytes,Ua SMALL (*)    Bacteria, UA RARE (*)    All other components within normal limits  CBC WITH DIFFERENTIAL/PLATELET - Abnormal; Notable for the following components:   Platelets 441 (*)    All other components within normal limits  COMPREHENSIVE METABOLIC PANEL - Abnormal; Notable for the following components:   Glucose, Bld 118 (*)    Creatinine, Ser 1.04 (*)    Calcium 8.6 (*)    Albumin 3.4 (*)    All other components within normal limits  URINE CULTURE  LIPASE, BLOOD   ____________________________________________  EKG    ____________________________________________  RADIOLOGY  No indication for acute imaging noted.  Previous CT scan reviewed.  She has had negative stool studies including negative C. difficile and her clinical history seem to suggest that this is likely secondary to nausea and vomiting due to her cephalexin.  She appears well nontoxic.  No pain on abdominal exam except for some mild suprapubic tenderness.  Negative for CVA tenderness bilateral at this time. ____________________________________________   PROCEDURES  Procedure(s) performed: None  Procedures  Critical Care performed: No  ____________________________________________   INITIAL IMPRESSION / ASSESSMENT AND PLAN / ED COURSE  Pertinent labs & imaging results that were available during my care of the patient were reviewed by me and considered in my medical decision making (see chart for details).   Patient presents for nausea and vomiting.  Clinical history highly associates this with when she takes her cephalexin.  She has had some ongoing loose stools and diarrhea but recently ruled out for C. difficile.  Her urine culture was not sent previously, but she does report overall symptomatology improving with cephalexin except when she takes it it causes her to get nauseated and vomit.  She does not appear septic.  Her vital signs are normal.  Will give antiemetics, recheck her lab work to evaluate for evidence of improvement or worsening or dehydration.  Difficult IV placement, will attempt to give a liter of fluid if possible.  Otherwise antiemetics, and likely switch to amoxicillin she reports good toleration of this antibiotic in the past.    Clinical Course as of Aug 30 1252  Thu Aug 31, 2018  1252 Patient resting comfortably.  Lab work reviewed white count lower, urinalysis appears less evidence of infection.  Patient is tolerated amoxicillin very well.  We will switch her to amoxicillin and discontinue Keflex.  Fully awake and  alert, no distress.  Reports she feels well at this time agreeable with the plan for careful return precautions and switching to amoxicillin for 5 days.   [MQ]    Clinical Course User Index [MQ] Sharyn Creamer, MD     ____________________________________________   FINAL CLINICAL IMPRESSION(S) / ED DIAGNOSES  Final diagnoses:  Lower urinary tract infectious disease  Cephalexin intolerance      Note:  This document was prepared using Dragon voice recognition software and may include unintentional dictation errors  Sharyn Creamer, MD 08/31/18 1254

## 2018-08-31 NOTE — ED Notes (Addendum)
Called lab about urine- stated they would run it

## 2018-08-31 NOTE — ED Notes (Signed)
Pt states new onset of sharp pelvic pain when she urinates

## 2018-08-31 NOTE — ED Notes (Addendum)
Pt states she has vomited about 7 times and had diarrhea about 5 times in the last 24 hours- pt given a cup to get a urine sample

## 2018-08-31 NOTE — Telephone Encounter (Signed)
  I spoke with patient -- she is feeling worse today with ABD pain, fever, sharp pain with urination and above pubic bone, diarrhea and fever.   She is having her sister pick her up and plans to go to ER due to feeling worse.  She did not go to urgent care yesterday.   LGuse FNP

## 2018-08-31 NOTE — Telephone Encounter (Signed)
Please call to follow up on how patient is doing today. Did she go to urgent care as advised yesterday?  Thanks,  LG

## 2018-08-31 NOTE — ED Notes (Signed)
IV tempted by this nurse x1 and by Ali Lowe, RN x2- Dr Fanny Bien notified and verbal order given for IV team consult

## 2018-08-31 NOTE — ED Triage Notes (Addendum)
Says she is not feeling better after last visit.  Says she is not tolerating the keflex.  Still low back and low abd pain.  Unsure of fever.

## 2018-09-01 LAB — URINE CULTURE
CULTURE: NO GROWTH
Special Requests: NORMAL

## 2018-09-11 ENCOUNTER — Telehealth: Payer: Self-pay | Admitting: Family Medicine

## 2018-09-11 DIAGNOSIS — B379 Candidiasis, unspecified: Secondary | ICD-10-CM

## 2018-09-11 MED ORDER — FLUCONAZOLE 150 MG PO TABS: ORAL_TABLET | ORAL | 0 refills | Status: DC

## 2018-09-11 NOTE — Telephone Encounter (Signed)
Copied from CRM [id number]. Topic: Quick Communication - See Telephone Encounter >> Sep 11, 2018  9:44 AM Aretta Nip wrote: CRM for notification. See Telephone encounter for: 09/11/18.PT called and said that she has been to the ER on 3/26 and was prescribed antibodies for her urinary tract infection and now she has a terrible yeast infection that is even in her rectum. She was to know if Lauren can call her in a med for this. The DR that prescribed was a Dr. Floydene Flock in ER at [facility]. I did not see the Amoxicillin in the chart that she spoke of. Ok for a telephone appt?

## 2018-09-11 NOTE — Telephone Encounter (Signed)
I will send in Diflucan for her to treat yeast  LG

## 2018-09-11 NOTE — Telephone Encounter (Signed)
Pt called [employer] [employer] for notification. See Telephone encounter for: 09/11/18.PT called and said that she has been to the ER on 3/26 and was prescribed antibodies for her urinary tract infection and now she has a terrible yeast infection that is even in her rectum. She was to know if Lauren can call her in a med for this. The DR that prescribed was a Dr. Floydene Flock in ER at [facility]. I did not see the Amoxicillin in the chart that she spoke of. Ok for a telephone appt?

## 2018-09-11 NOTE — Telephone Encounter (Signed)
Called Pt to tell her the Rx was sent to the pharmacy for pick up 

## 2018-09-25 ENCOUNTER — Other Ambulatory Visit: Payer: Self-pay

## 2018-09-25 ENCOUNTER — Emergency Department: Payer: Self-pay

## 2018-09-25 ENCOUNTER — Emergency Department
Admission: EM | Admit: 2018-09-25 | Discharge: 2018-09-25 | Disposition: A | Discharge status: Home / Self Care | Payer: Self-pay | Attending: Emergency Medicine | Admitting: Emergency Medicine

## 2018-09-25 DIAGNOSIS — R1033 Periumbilical pain: Secondary | ICD-10-CM | POA: Insufficient documentation

## 2018-09-25 DIAGNOSIS — F17210 Nicotine dependence, cigarettes, uncomplicated: Secondary | ICD-10-CM | POA: Insufficient documentation

## 2018-09-25 DIAGNOSIS — Z79899 Other long term (current) drug therapy: Secondary | ICD-10-CM | POA: Insufficient documentation

## 2018-09-25 DIAGNOSIS — N201 Calculus of ureter: Secondary | ICD-10-CM | POA: Insufficient documentation

## 2018-09-25 LAB — COMPREHENSIVE METABOLIC PANEL
ALT: 20 U/L (ref 0–44)
AST: 22 U/L (ref 15–41)
Albumin: 3.7 g/dL (ref 3.5–5.0)
Alkaline Phosphatase: 98 U/L (ref 38–126)
Anion gap: 9 (ref 5–15)
BUN: 12 mg/dL (ref 6–20)
CO2: 23 mmol/L (ref 22–32)
Calcium: 9.3 mg/dL (ref 8.9–10.3)
Chloride: 106 mmol/L (ref 98–111)
Creatinine, Ser: 0.98 mg/dL (ref 0.44–1.00)
GFR calc Af Amer: >60 mL/min (ref >60)
GFR calc non Af Amer: >60 mL/min (ref >60)
Glucose, Bld: 154 mg/dL — ABNORMAL HIGH (ref 70–99)
Potassium: 3.9 mmol/L (ref 3.5–5.1)
Sodium: 138 mmol/L (ref 135–145)
Total Bilirubin: 0.3 mg/dL (ref 0.3–1.2)
Total Protein: 7.5 g/dL (ref 6.5–8.1)

## 2018-09-25 LAB — URINALYSIS, COMPLETE (UACMP) WITH MICROSCOPIC
Bilirubin Urine: NEGATIVE
Glucose, UA: NEGATIVE mg/dL
Ketones, ur: NEGATIVE mg/dL
Nitrite: NEGATIVE
Protein, ur: NEGATIVE mg/dL
Specific Gravity, Urine: 1.012 (ref 1.005–1.030)
WBC, UA: >50 WBC/hpf — ABNORMAL HIGH (ref 0–5)
pH: 7 (ref 5.0–8.0)

## 2018-09-25 LAB — CBC WITH DIFFERENTIAL/PLATELET
Abs Immature Granulocytes: 0.03 10*3/uL (ref 0.00–0.07)
Basophils Absolute: 0 10*3/uL (ref 0.0–0.1)
Basophils Relative: 0 %
Eosinophils Absolute: 0.1 10*3/uL (ref 0.0–0.5)
Eosinophils Relative: 1 %
HCT: 40.3 % (ref 36.0–46.0)
Hemoglobin: 12.9 g/dL (ref 12.0–15.0)
Immature Granulocytes: 0 %
Lymphocytes Relative: 20 %
Lymphs Abs: 1.4 10*3/uL (ref 0.7–4.0)
MCH: 28 pg (ref 26.0–34.0)
MCHC: 32 g/dL (ref 30.0–36.0)
MCV: 87.4 fL (ref 80.0–100.0)
Monocytes Absolute: 0.5 10*3/uL (ref 0.1–1.0)
Monocytes Relative: 7 %
Neutro Abs: 5 10*3/uL (ref 1.7–7.7)
Neutrophils Relative %: 72 %
Platelets: 325 10*3/uL (ref 150–400)
RBC: 4.61 MIL/uL (ref 3.87–5.11)
RDW: 13.8 % (ref 11.5–15.5)
WBC: 7.1 10*3/uL (ref 4.0–10.5)
nRBC: 0 % (ref 0.0–0.2)

## 2018-09-25 LAB — POCT PREGNANCY, URINE: Preg Test, Ur: NEGATIVE

## 2018-09-25 LAB — LIPASE, BLOOD: Lipase: 30 U/L (ref 11–51)

## 2018-09-25 MED ORDER — OXYCODONE HCL 5 MG PO TABS: 5.0000 mg | ORAL_TABLET | Freq: Four times a day (QID) | ORAL | 0 refills | Status: DC | PRN

## 2018-09-25 MED ORDER — ONDANSETRON HCL 4 MG/2ML IJ SOLN
4.0000 mg | Freq: Once | INTRAMUSCULAR | Status: AC
  Administered 2018-09-25: 4 mg via INTRAVENOUS
  Filled 2018-09-25: qty 2

## 2018-09-25 MED ORDER — SODIUM CHLORIDE 0.9 % IV BOLUS
1000.0000 mL | Freq: Once | INTRAVENOUS | Status: AC
  Administered 2018-09-25: 1000 mL via INTRAVENOUS

## 2018-09-25 MED ORDER — HYDROMORPHONE HCL 1 MG/ML IJ SOLN
1.0000 mg | Freq: Once | INTRAMUSCULAR | Status: AC
  Administered 2018-09-25: 1 mg via INTRAVENOUS
  Filled 2018-09-25: qty 1

## 2018-09-25 MED ORDER — IOHEXOL 300 MG/ML  SOLN
100.0000 mL | Freq: Once | INTRAMUSCULAR | Status: AC | PRN
  Administered 2018-09-25: 100 mL via INTRAVENOUS

## 2018-09-25 MED ORDER — HYDROMORPHONE HCL 1 MG/ML IJ SOLN
1.0000 mg | Freq: Once | INTRAMUSCULAR | Status: AC
  Administered 2018-09-25: 12:00:00 1 mg via INTRAVENOUS
  Filled 2018-09-25: qty 1

## 2018-09-25 MED ORDER — NITROFURANTOIN MACROCRYSTAL 100 MG PO CAPS: 100.0000 mg | ORAL_CAPSULE | Freq: Two times a day (BID) | ORAL | 0 refills | Status: DC

## 2018-09-25 MED ORDER — TAMSULOSIN HCL 0.4 MG PO CAPS: 0.4000 mg | ORAL_CAPSULE | Freq: Every day | ORAL | 0 refills | Status: DC

## 2018-09-25 MED ORDER — SODIUM CHLORIDE 0.9 % IV SOLN
1.0000 g | Freq: Once | INTRAVENOUS | Status: AC
  Administered 2018-09-25: 10:00:00 1 g via INTRAVENOUS
  Filled 2018-09-25: qty 10

## 2018-09-25 MED ORDER — KETOROLAC TROMETHAMINE 30 MG/ML IJ SOLN
15.0000 mg | INTRAMUSCULAR | Status: AC
  Administered 2018-09-25: 08:00:00 15 mg via INTRAVENOUS
  Filled 2018-09-25: qty 1

## 2018-09-25 NOTE — ED Provider Notes (Signed)
[facility] Emergency Department Provider Note  ____________________________________________  Time seen: Approximately 7:47 AM  I have reviewed the triage vital signs and the nursing notes.   HISTORY  Chief Complaint Abdominal Pain    HPI Destiny Myers is a 43 y.o. female history of hypothyroidism and prior cholecystectomy who complains of abdominal pain that started this morning, gradual onset, worsening, constant, radiating from periumbilical area to right lower quadrant.  Denies fevers or chills.  Vomited once this morning.  Reports that yesterday she had malaise and decreased appetite as well.  Recently finished her menstrual period which she reports is been heavy over the past several months.   Past Medical History:  Diagnosis Date  . Anxiety   . Depression   . Hypothyroidism   . Kidney stones   . PTSD (post-traumatic stress disorder)   . UTI (urinary tract infection)      Patient Active Problem List   Diagnosis Date Noted  . Delirium 08/25/2018  . Benzodiazepine abuse (HCC) 08/25/2018  . History of suicide attempt 07/31/2018  . Anxiety 07/31/2018  . Neck pain 07/31/2018  . Tingling of upper extremity 07/31/2018  . Cocaine use disorder, moderate, dependence (HCC) 06/02/2016  . Self-inflicted laceration of wrist 06/02/2016  . Tobacco use disorder 06/17/2015  . PTSD (post-traumatic stress disorder) 06/17/2015  . Severe recurrent major depression without psychotic features (HCC) 06/16/2015  . Opiate abuse, episodic (HCC) 06/16/2015  . Alcohol use disorder, mild, abuse 06/16/2015  . Suicide attempt (HCC) 06/16/2015  . Hypothyroidism 06/16/2015     Past Surgical History:  Procedure Laterality Date  . CESAREAN SECTION    . CHOLECYSTECTOMY    . CHOLECYSTECTOMY    . SHOULDER SURGERY Right      Prior to Admission medications   Medication Sig Start Date End Date Taking? Authorizing Provider  cloNIDine (CATAPRES) 0.1 MG tablet Take  1 tablet (0.1 mg total) by mouth 2 (two) times daily. 08/15/18  Yes Guse, Janna Arch, FNP  levothyroxine (SYNTHROID, LEVOTHROID) 25 MCG tablet Take 1 tablet (25 mcg total) by mouth daily before breakfast. 08/02/18  Yes Guse, Janna Arch, FNP  Vitamin D, Ergocalciferol, (DRISDOL) 1.25 MG (50000 UT) CAPS capsule Take 1 capsule (50,000 Units total) by mouth every 7 (seven) days. 08/02/18  Yes Guse, Janna Arch, FNP  fluconazole (DIFLUCAN) 150 MG tablet Take 1st tablet on day 1, take second tablet on day 5 Patient not taking: Reported on 09/25/2018 09/11/18   Tracey Harries, FNP  hydrOXYzine (ATARAX/VISTARIL) 10 MG tablet Take 1 tablet (10 mg total) by mouth 3 (three) times daily as needed for anxiety. Patient not taking: Reported on 08/25/2018 08/14/18   Tracey Harries, FNP  nitrofurantoin (MACRODANTIN) 100 MG capsule Take 1 capsule (100 mg total) by mouth 2 (two) times daily. 09/25/18   Sharman Cheek, MD  ondansetron (ZOFRAN ODT) 4 MG disintegrating tablet Take 1 tablet (4 mg total) by mouth every 6 (six) hours as needed for nausea or vomiting. 08/31/18   Sharyn Creamer, MD  oxyCODONE (ROXICODONE) 5 MG immediate release tablet Take 1-2 tablets (5-10 mg total) by mouth every 6 (six) hours as needed for severe pain or breakthrough pain. 09/25/18   Sharman Cheek, MD  predniSONE (DELTASONE) 20 MG tablet Take 2 tablets (40 mg total) by mouth daily with breakfast. Patient not taking: Reported on 08/25/2018 08/18/18   Sharyn Creamer, MD  tamsulosin (FLOMAX) 0.4 MG CAPS capsule Take 1 capsule (0.4 mg total) by mouth daily. 09/25/18  Sharman Cheek, MD     Allergies Keflex [cephalexin]; Sulfa antibiotics; Tramadol; and Tylenol [acetaminophen]   Family History  Problem Relation Age of Onset  . COPD Mother   . Depression Mother   . Early death Mother   . Hypertension Mother   . Mental illness Mother   . Heart disease Father   . Heart attack Father   . Hyperlipidemia Father   . Hypertension Father   . Breast cancer  Maternal Grandmother   . Depression Maternal Grandmother   . Cancer Paternal Grandmother   . Birth defects Paternal Grandmother     Social History Social History   Tobacco Use  . Smoking status: Current Every Day Smoker    Packs/day: 0.50    Types: Cigarettes  . Smokeless tobacco: Never Used  Substance Use Topics  . Alcohol use: Yes    Comment: socially  . Drug use: Not Currently    Comment: every now and then    Review of Systems  Constitutional:   No fever or chills.  ENT:   No sore throat. No rhinorrhea. Cardiovascular:   No chest pain or syncope. Respiratory:   No dyspnea or cough. Gastrointestinal:   Positive as above for abdominal pain and vomiting.  Musculoskeletal:   Negative for focal pain or swelling All other systems reviewed and are negative except as documented above in ROS and HPI.  ____________________________________________   PHYSICAL EXAM:  VITAL SIGNS: ED Triage Vitals  Enc Vitals Group     BP 09/25/18 0734 (!) 152/90     Pulse Rate 09/25/18 0734 85     Resp 09/25/18 0734 17     Temp 09/25/18 0734 98 F (36.7 C)     Temp Source 09/25/18 0734 Oral     SpO2 09/25/18 0734 98 %     Weight 09/25/18 0731 210 lb (95.3 kg)     Height 09/25/18 0731  (1.626 m)     Head Circumference --      Peak Flow --      Pain Score 09/25/18 0731 9     Pain Loc --      Pain Edu? --      Excl. in GC? --     Vital signs reviewed, nursing assessments reviewed.   Constitutional:   Alert and oriented. Non-toxic appearance. Eyes:   Conjunctivae are normal. EOMI. PERRL. ENT      Head:   Normocephalic and atraumatic.      Nose:   No congestion/rhinnorhea.       Mouth/Throat:   Dry mucous membranes, no pharyngeal erythema. No peritonsillar mass.       Neck:   No meningismus. Full ROM. Hematological/Lymphatic/Immunilogical:   No cervical lymphadenopathy. Cardiovascular:   RRR. Symmetric bilateral radial and DP pulses.  No murmurs. Cap refill less than 2  seconds. Respiratory:   Normal respiratory effort without tachypnea/retractions. Breath sounds are clear and equal bilaterally. No wheezes/rales/rhonchi. Gastrointestinal:   Soft with diffuse tenderness, worse in the right lower quadrant. Non distended. There is no CVA tenderness.  No rebound, rigidity, or guarding. Musculoskeletal:   Normal range of motion in all extremities. No joint effusions.  No lower extremity tenderness.  No edema. Neurologic:   Normal speech and language.  Motor grossly intact. No acute focal neurologic deficits are appreciated.  Skin:    Skin is warm, dry and intact. No rash noted.  No petechiae, purpura, or bullae.  ____________________________________________    LABS (pertinent positives/negatives) (all labs  ordered are listed, but only abnormal results are displayed) Labs Reviewed  COMPREHENSIVE METABOLIC PANEL - Abnormal; Notable for the following components:      Result Value   Glucose, Bld 154 (*)    All other components within normal limits  URINALYSIS, COMPLETE (UACMP) WITH MICROSCOPIC - Abnormal; Notable for the following components:   Color, Urine YELLOW (*)    APPearance CLOUDY (*)    Hgb urine dipstick LARGE (*)    Leukocytes,Ua MODERATE (*)    WBC, UA >50 (*)    Bacteria, UA FEW (*)    All other components within normal limits  URINE CULTURE  LIPASE, BLOOD  CBC WITH DIFFERENTIAL/PLATELET  POC URINE PREG, ED  POCT PREGNANCY, URINE   ____________________________________________   EKG  Interpreted by me Sinus rhythm rate of 85, normal axis intervals QRS ST segments and T waves  ____________________________________________    RADIOLOGY  Ct Abdomen Pelvis W Contrast  Result Date: 09/25/2018 CLINICAL DATA:  Abdominal pain and nausea EXAM: CT ABDOMEN AND PELVIS WITH CONTRAST TECHNIQUE: Multidetector CT imaging of the abdomen and pelvis was performed using the standard protocol following bolus administration of intravenous contrast.  CONTRAST:  OMNIPAQUE IOHEXOL 300 MG/ML  SOLN COMPARISON:  August 24, 2018 FINDINGS: Lower chest: Lung bases are clear. Hepatobiliary: There is a degree of hepatic steatosis. No focal liver lesions are appreciable. Gallbladder is absent. There is no biliary duct dilatation. Pancreas: No pancreatic mass or inflammatory focus. Spleen: No splenic lesions evident. Adrenals/Urinary Tract: Adrenals bilaterally appear normal. Right kidney is mildly edematous. No renal mass is appreciable on either side. There is moderate hydronephrosis on the right. There is no hydronephrosis on the left. There is no intrarenal calculus on either side. There is a calculus at the right ureteropelvic junction measuring 7 x 4 mm. No other ureteral calculi are appreciable. The urinary bladder is essentially empty. Urinary bladder wall thickness is felt to be within normal limits for essentially empty state. Stomach/Bowel: There is no appreciable bowel wall or mesenteric thickening. There is no evident bowel obstruction. There is no free air or portal venous air. Vascular/Lymphatic: There is no abdominal aortic aneurysm. No vascular lesions are demonstrable on this study. There is no adenopathy in the abdomen or pelvis. Reproductive: The uterus is anteverted.  No evident pelvic mass. Other: Appendix appears unremarkable. There is no abscess or ascites in the abdomen or pelvis. There is a minimal ventral hernia containing only fat. Musculoskeletal: No blastic or lytic bone lesions. No intramuscular lesion evident. IMPRESSION: 1. 7 x 4 mm calculus at the right ureteropelvic junction causing moderate hydronephrosis on the right. Right kidney is edematous. 2. No bowel obstruction. No abscess in the abdomen or pelvis. Appendix appears normal. 3.  Hepatic steatosis. 4.  Rather minimal ventral hernia containing only fat. Electronically Signed   By: Bretta Bang III M.D.   On: 09/25/2018 09:20     ____________________________________________   PROCEDURES Procedures  ____________________________________________  DIFFERENTIAL DIAGNOSIS   Appendicitis, urinary tract infection, pyelonephritis, renal colic, ovarian cyst, pregnancy/ectopic.  Doubt STI PID TOA.  CLINICAL IMPRESSION / ASSESSMENT AND PLAN / ED COURSE  Medications ordered in the ED: Medications  HYDROmorphone (DILAUDID) injection 1 mg (has no administration in time range)  sodium chloride 0.9 % bolus 1,000 mL (1,000 mLs Intravenous New Bag/Given 09/25/18 0757)  ondansetron (ZOFRAN) injection 4 mg (4 mg Intravenous Given 09/25/18 0757)  ketorolac (TORADOL) 30 MG/ML injection 15 mg (15 mg Intravenous Given 09/25/18 0758)  iohexol (  OMNIPAQUE) 300 MG/ML solution 100 mL (100 mLs Intravenous Contrast Given 09/25/18 0905)  cefTRIAXone (ROCEPHIN) 1 g in sodium chloride 0.9 % 100 mL IVPB (0 g Intravenous Stopped 09/25/18 1113)  HYDROmorphone (DILAUDID) injection 1 mg (1 mg Intravenous Given 09/25/18 [id number])    Pertinent labs & imaging results that were available during my care of the patient were reviewed by me and considered in my medical decision making (see chart for details).  Shelbie AmmonsJennifer K Lapoint was evaluated in Emergency Department on 09/25/2018 for the symptoms described in the history of present illness. She was evaluated in the context of the global COVID-19 pandemic, which necessitated consideration that the patient might be at risk for infection with the SARS-CoV-2 virus that causes COVID-19. Institutional protocols and algorithms that pertain to the evaluation of patients at risk for COVID-19 are in a state of rapid change based on information released by regulatory bodies including the CDC and federal and state organizations. These policies and algorithms were followed during the patient's care in the ED.   Patient presents with lower abdominal pain, most concerning for appendicitis although afebrile.  Differential is  broad.  Will check labs, proceed to CT scan of the abdomen and pelvis.  IV fluids for hydration, Zofran and Toradol for symptom relief.  Clinical Course as of Sep 24 1124  Mon Sep 25, 2018  1052 Discussed with urology Dr. Apolinar JunesBrandon who agrees that if pain is controllable patient can be discharged home.  They can follow-up with a virtual visit tomorrow and plan for shockwave therapy in 3 days if patient is agreeable.   [PS]    Clinical Course User Index [PS] Sharman CheekStafford, Brent Taillon, MD     ----------------------------------------- 11:25 AM on 09/25/2018 -----------------------------------------  Pain improved.  Vital signs remained stable.  Has telemedicine appointment with Dr. Apolinar JunesBrandon tomorrow.  I will prescribe oxycodone, Macrobid, Flomax.  Presentation not currently consistent with urinary tract infection or pyelonephritis.  ____________________________________________   FINAL CLINICAL IMPRESSION(S) / ED DIAGNOSES    Final diagnoses:  Periumbilical abdominal pain  Ureterolithiasis     ED Discharge Orders         Ordered    oxyCODONE (ROXICODONE) 5 MG immediate release tablet  Every 6 hours PRN     09/25/18 1124    nitrofurantoin (MACRODANTIN) 100 MG capsule  2 times daily     09/25/18 1124    tamsulosin (FLOMAX) 0.4 MG CAPS capsule  Daily     09/25/18 1124          Portions of this note were generated with dragon dictation software. Dictation errors may occur despite best attempts at proofreading.   Sharman CheekStafford, Jaylen Claude, MD 09/25/18 1126

## 2018-09-25 NOTE — ED Triage Notes (Signed)
Pt arrived via ems for report of abd pain - it started this am - vomited x1 with nausea - pain is sharp in nature - pt does not have gallbladder

## 2018-09-25 NOTE — ED Notes (Signed)
Pt given cup of water 

## 2018-09-25 NOTE — ED Notes (Signed)
Patient transported to CT 

## 2018-09-25 NOTE — ED Notes (Signed)
Pt aware of need for a urine sample. 

## 2018-09-25 NOTE — ED Notes (Signed)
Pt states she is currently working on arranging discharge transportation

## 2018-09-25 NOTE — Telephone Encounter (Signed)
Case discussed with Dr. Scotty Court in the emergency room.  43 year old female with a 7 mm right proximal ureteral calculus with contaminated appearing urine, otherwise afebrile and pain able to be controlled.  I like to set her up for virtual visit tomorrow with me to discuss treatment options.  Vanna Scotland, MD

## 2018-09-26 ENCOUNTER — Telehealth (INDEPENDENT_AMBULATORY_CARE_PROVIDER_SITE_OTHER): Payer: Self-pay | Admitting: Urology

## 2018-09-26 ENCOUNTER — Telehealth: Payer: Self-pay | Admitting: Radiology

## 2018-09-26 ENCOUNTER — Other Ambulatory Visit: Payer: Self-pay | Admitting: Radiology

## 2018-09-26 DIAGNOSIS — R109 Unspecified abdominal pain: Secondary | ICD-10-CM

## 2018-09-26 DIAGNOSIS — N10 Acute tubulo-interstitial nephritis: Secondary | ICD-10-CM

## 2018-09-26 DIAGNOSIS — N201 Calculus of ureter: Secondary | ICD-10-CM

## 2018-09-26 MED ORDER — HYDROMORPHONE HCL 2 MG PO TABS: 2.0000 mg | ORAL_TABLET | Freq: Four times a day (QID) | ORAL | 0 refills | Status: DC | PRN

## 2018-09-26 MED ORDER — CIPROFLOXACIN HCL 500 MG PO TABS: 500.0000 mg | ORAL_TABLET | Freq: Two times a day (BID) | ORAL | 0 refills | Status: DC

## 2018-09-26 NOTE — Telephone Encounter (Signed)
Notified patient of extracorporeal shockwave lithotripsy scheduled on 09/28/2018 at 8:00. Patient should arrive at that time to Same Day Surgery. Pre-op & discharge instructions for lithotripsy were discussed with patient.    Advised patient to hold NSAIDS & aspirin products prior to the procedure beginning immediately.  Patient's questions were answered & she expresses understanding of these instructions.    Nicolasa Ducking, RN

## 2018-09-27 LAB — URINE CULTURE: Culture: >=100000 — AB

## 2018-09-28 ENCOUNTER — Ambulatory Visit: Payer: Self-pay

## 2018-09-28 ENCOUNTER — Other Ambulatory Visit: Payer: Self-pay

## 2018-09-28 ENCOUNTER — Encounter
Admission: RE | Disposition: A | Discharge status: Home / Self Care | Payer: Self-pay | Source: Home / Self Care | Attending: Urology

## 2018-09-28 ENCOUNTER — Ambulatory Visit
Admission: RE | Admit: 2018-09-28 | Discharge: 2018-09-28 | Disposition: A | Discharge status: Home / Self Care | Payer: Self-pay | Attending: Urology | Admitting: Urology

## 2018-09-28 DIAGNOSIS — N201 Calculus of ureter: Secondary | ICD-10-CM | POA: Insufficient documentation

## 2018-09-28 DIAGNOSIS — E669 Obesity, unspecified: Secondary | ICD-10-CM | POA: Insufficient documentation

## 2018-09-28 DIAGNOSIS — F17200 Nicotine dependence, unspecified, uncomplicated: Secondary | ICD-10-CM | POA: Insufficient documentation

## 2018-09-28 HISTORY — PX: EXTRACORPOREAL SHOCK WAVE LITHOTRIPSY: SHX1557

## 2018-09-28 LAB — URINE DRUG SCREEN, QUALITATIVE (ARMC ONLY)
Amphetamines, Ur Screen: NOT DETECTED
Barbiturates, Ur Screen: NOT DETECTED
Benzodiazepine, Ur Scrn: POSITIVE — AB
Cannabinoid 50 Ng, Ur ~~LOC~~: NOT DETECTED
Cocaine Metabolite,Ur ~~LOC~~: NOT DETECTED
MDMA (Ecstasy)Ur Screen: NOT DETECTED
Methadone Scn, Ur: NOT DETECTED
Opiate, Ur Screen: POSITIVE — AB
Phencyclidine (PCP) Ur S: NOT DETECTED
Tricyclic, Ur Screen: NOT DETECTED

## 2018-09-28 LAB — POCT PREGNANCY, URINE: Preg Test, Ur: NEGATIVE

## 2018-09-28 SURGERY — LITHOTRIPSY, ESWL
Anesthesia: Moderate Sedation | Laterality: Right

## 2018-09-28 MED ORDER — ONDANSETRON HCL 4 MG/2ML IJ SOLN
INTRAMUSCULAR | Status: AC
  Administered 2018-09-28: 4 mg via INTRAVENOUS
  Filled 2018-09-28: qty 2

## 2018-09-28 MED ORDER — ONDANSETRON HCL 4 MG PO TABS: 4.0000 mg | ORAL_TABLET | Freq: Three times a day (TID) | ORAL | 0 refills | Status: DC | PRN

## 2018-09-28 MED ORDER — DIPHENHYDRAMINE HCL 25 MG PO CAPS
25.0000 mg | ORAL_CAPSULE | ORAL | Status: AC
  Administered 2018-09-28: 09:00:00 25 mg via ORAL

## 2018-09-28 MED ORDER — DIPHENHYDRAMINE HCL 25 MG PO CAPS
ORAL_CAPSULE | ORAL | Status: AC
  Administered 2018-09-28: 25 mg via ORAL
  Filled 2018-09-28: qty 1

## 2018-09-28 MED ORDER — SODIUM CHLORIDE 0.9 % IV SOLN
INTRAVENOUS | Status: DC
  Administered 2018-09-28: 09:00:00 via INTRAVENOUS

## 2018-09-28 MED ORDER — HYDROMORPHONE HCL 2 MG PO TABS: 2.0000 mg | ORAL_TABLET | Freq: Four times a day (QID) | ORAL | 0 refills | Status: DC | PRN

## 2018-09-28 MED ORDER — DIAZEPAM 5 MG PO TABS
10.0000 mg | ORAL_TABLET | ORAL | Status: AC
  Administered 2018-09-28: 09:00:00 10 mg via ORAL

## 2018-09-28 MED ORDER — ONDANSETRON HCL 4 MG/2ML IJ SOLN
4.0000 mg | Freq: Once | INTRAMUSCULAR | Status: AC | PRN
  Administered 2018-09-28: 4 mg via INTRAVENOUS

## 2018-09-28 MED ORDER — ONDANSETRON HCL 2 MG/ML IV SOLN: 4.0000 mg | Freq: Once | INTRAVENOUS | Status: DC | PRN

## 2018-09-28 MED ORDER — DIAZEPAM 5 MG PO TABS
ORAL_TABLET | ORAL | Status: AC
  Administered 2018-09-28: 10 mg via ORAL
  Filled 2018-09-28: qty 2

## 2018-09-28 NOTE — Discharge Instructions (Signed)
See [facility] discharge instructions in chart.  AMBULATORY SURGERY  DISCHARGE INSTRUCTIONS   1) The drugs that you were given will stay in your system until tomorrow so for the next 24 hours you should not:  A) Drive an automobile B) Make any legal decisions C) Drink any alcoholic beverage   2) You may resume regular meals tomorrow.  Today it is better to start with liquids and gradually work up to solid foods.  You may eat anything you prefer, but it is better to start with liquids, then soup and crackers, and gradually work up to solid foods.   3) Please notify your doctor immediately if you have any unusual bleeding, trouble breathing, redness and pain at the surgery site, drainage, fever, or pain not relieved by medication.    4) Additional Instructions:        Please contact your physician with any problems or Same Day Surgery at [phone], Monday through Friday 6 am to 4 pm, or [place] [facility] Main number at [phone].  

## 2018-09-28 NOTE — OR Nursing (Signed)
Transported to [employer] truck via wheelchair

## 2018-09-28 NOTE — Progress Notes (Signed)
Ch visited pt in pre-op. Pt shared that she was here to hv a kidney stone broken and that she has had several kidney stones in the past post-pregnancy. Pt is currently separated from her daughter since the COVID-19 restrictions have been in place. Ch allowed pt to grieve being separated from her child during such a time of great uncertainty. Pt wanted prayer for forgiveness and to be able to move on after getting a divorce. Ch offered words of encouragement and gave pt a chance to release what was burdening her heart before going in for surgery. CH visited was appreciated.     09/28/18 0900  Clinical Encounter Type  Visited With Patient  Visit Type Psychological support;Spiritual support;Social support;Pre-op  Spiritual Encounters  Spiritual Needs Prayer;Emotional;Grief support  Stress Factors  Patient Stress Factors Family relationships;Exhausted;Loss of control;Major life changes  Family Stress Factors Family relationships

## 2018-09-28 NOTE — Progress Notes (Signed)
Virtual Visit via Video Note  I connected with Destiny AmmonsJennifer K Kasel on 09/26/2018 at  1:30 PM EDT by a video enabled telemedicine application and verified that I am speaking with the correct person using two identifiers.   I discussed the limitations of evaluation and management by telemedicine and the availability of in person appointments. The patient expressed understanding and agreed to proceed.  History of Present Illness: 43 year old female who presents today as a new patient visit via virtual visit to discuss recent emergency room visit for right obstructing ureteral calculus.  She was seen and evaluated in the emergency room on 09/25/2026 with severe acute onset right flank pain.  She described the pain in her flank radiating to her right lower quadrant.  This was associated with nausea and vomiting.  No fevers or chills.  No dysuria or gross hematuria.  In the emergency room, she underwent noncontrast CT scan which indicated a 7 mm right UPJ stone with proximal hydroureteronephrosis.  Her urine was mildly suspicious and is growing gram-negative rods currently.  She was started on Macrobid for presumed UTI.  Normal WBC.  Creatinine normal.  She was hemodynamically stable and ultimately her pain is able to be controlled and discharged home.  Today, she reports she continues to have flank pain.  She now feels like her pain is little bit lower and she is having pubic pain as well.  She continues to have fairly significant nausea.  She does not feel like her pain is being adequately controlled with Percocet when taking 2 tablets.  She has had a low-grade temp to 99 but no associated urinary symptoms.  She does have remote history of stones after having her twins 15 years ago.  No previous stone procedures.  She does note today that she was seen in the emergency room about a month ago and had a CT scan with contrast.  At that time, she had no obvious obstructing stones but she did have nephromegaly.   She was treated for presumed UTI at that time as well.   Observations/Objective: Appears mildly uncomfortable today by telephone.  Assessment and Plan:  1. Right ureteral stone Right proximal ureteral stone with refractory poorly controlled pain  We discussed that given the size of the stone, she may be able to pass it spontaneously, however given her significant discomfort, would more strongly recommend surgical intervention  We discussed various treatment options including ESWL vs. ureteroscopy, laser lithotripsy, and stent. We discussed the risks and benefits of both including bleeding, infection, damage to surrounding structures, efficacy with need for possible further intervention, and need for temporary ureteral stent.  Point time, she is most interested in shockwave lithotripsy.  We will tentatively book this for Thursday.  Again, we reviewed warning symptoms and more urgent indications for intervention if she has worsening symptoms, poorly controlled pain, or fever/chills.  2. Right flank pain Poorly controlled pain, given her history of narcotics use, she may have a higher tolerance I have agreed to prescribe her Dilaudid 2 mg x 6 tablets today for breakthrough pain, advised to take this only in emergencies as needed  3. Acute UTI We discussed the risk of urinary tract infection in the setting of an obstructing stone as well as indications for more urgent evaluation including fever to 100.4 or higher She was given Macrobid which is not sufficient tissue penetration if she does have a possible upper tract infection, will change to Cipro 500 mg twice daily which she has previously tolerated  She understands the risk of tendon rupture as relates to Cipro  Follow Up Instructions:    I discussed the assessment and treatment plan with the patient. The patient was provided an opportunity to ask questions and all were answered. The patient agreed with the plan and demonstrated an  understanding of the instructions.   The patient was advised to call back or seek an in-person evaluation if the symptoms worsen or if the condition fails to improve as anticipated.  I provided 30 minutes of non-face-to-face time during this encounter.   Vanna Scotland, MD

## 2018-09-29 ENCOUNTER — Encounter: Payer: Self-pay | Admitting: Urology

## 2018-09-29 MED ORDER — HYDROCODONE-ACETAMINOPHEN 5-325 MG PO TABS: 1.0000 | ORAL_TABLET | ORAL | 0 refills | Status: DC | PRN

## 2018-09-29 MED ORDER — HYDROMORPHONE HCL 2 MG PO TABS: 2.0000 mg | ORAL_TABLET | Freq: Four times a day (QID) | ORAL | 0 refills | Status: DC | PRN

## 2018-09-29 NOTE — Addendum Note (Signed)
Addended by: Modena Slater D III on: 09/29/2018 03:11 PM   Modules accepted: Orders

## 2018-09-29 NOTE — Addendum Note (Signed)
Addended by: Modena Slater D III on: 09/29/2018 03:49 PM   Modules accepted: Orders

## 2018-09-29 NOTE — Progress Notes (Signed)
Spoke with patient. Almost out of dilaudid so I called her in more. She also had a low grade fever ~100. She is on cipro. I advised her to go to the ER if symptoms worsen especially increase in temp.

## 2018-10-10 ENCOUNTER — Telehealth (INDEPENDENT_AMBULATORY_CARE_PROVIDER_SITE_OTHER): Payer: Self-pay | Admitting: Urology

## 2018-10-10 ENCOUNTER — Other Ambulatory Visit: Payer: Self-pay

## 2018-10-10 DIAGNOSIS — N201 Calculus of ureter: Secondary | ICD-10-CM

## 2018-10-10 DIAGNOSIS — N3000 Acute cystitis without hematuria: Secondary | ICD-10-CM

## 2018-10-10 MED ORDER — CIPROFLOXACIN HCL 500 MG PO TABS: 500.0000 mg | ORAL_TABLET | Freq: Two times a day (BID) | ORAL | 0 refills | Status: DC

## 2018-10-10 NOTE — Progress Notes (Signed)
Virtual Visit via Video Note  I connected with Destiny Myers on 10/10/18 at  1:00 PM EDT by a video enabled telemedicine application and verified that I am speaking with the correct person using two identifiers.  Location: Patient: home Provider: home   I discussed the limitations of evaluation and management by telemedicine and the availability of in person appointments. The patient expressed understanding and agreed to proceed.  History of Present Illness: 43 year old female with a history of nephrolithiasis 2-week status post ESWL for right ureteral calculus.  At the time of the procedure, the stone had migrated distally.  Following the procedure, she did pass multiple pieces of the stone.  Has not passed any further pieces over the past week.  She continues to have some pain in her right flank.  She also wonders if she has another urinary tract infection.  Notably, she did grow Proteus out of her urine preoperatively and was treated with a seven-day course of Cipro.  She continues to have urgency, frequency and some dysuria.  She also continues to complain of some low-grade temps up to 100.  No nausea or vomiting.   Observations/Objective: Well appearing, no distres  Assessment and Plan:  1. Right ureteral stone S/p ESWL 2 weeks ago She has passed multiple fragments, unclear if she has any residual stone burden Advised to go get KUB today versus tomorrow, stress importance of this - CULTURE, URINE COMPREHENSIVE - Urinalysis, Complete  2. Acute cystitis without hematuria UA/urine culture recommended, she will come by the office later today versus early tomorrow to drop this off We will go ahead and treat her for presumed urinary tract infection with Cipro twice daily based on her previous urine culture growing Proteus Warning symptoms reviewed if she spikes a fever 100.4 higher, she should go to the emergency room for further evaluation as she may possibly still have an  obstructing stone She agrees with this plan   Follow Up Instructions: Call with KUB/ UCx results    I discussed the assessment and treatment plan with the patient. The patient was provided an opportunity to ask questions and all were answered. The patient agreed with the plan and demonstrated an understanding of the instructions.   The patient was advised to call back or seek an in-person evaluation if the symptoms worsen or if the condition fails to improve as anticipated.  I provided 12 minutes of non-face-to-face time during this encounter.   Vanna Scotland, MD

## 2018-10-11 ENCOUNTER — Telehealth: Payer: Self-pay

## 2018-10-11 ENCOUNTER — Other Ambulatory Visit: Payer: Self-pay

## 2018-10-11 NOTE — Telephone Encounter (Signed)
Left patient message to call to follow up on symptoms

## 2018-10-11 NOTE — Telephone Encounter (Signed)
Error

## 2018-10-12 ENCOUNTER — Ambulatory Visit
Admission: RE | Admit: 2018-10-12 | Discharge: 2018-10-12 | Disposition: A | Discharge status: Home / Self Care | Payer: Self-pay | Attending: Urology | Admitting: Urology

## 2018-10-12 ENCOUNTER — Other Ambulatory Visit: Payer: Self-pay

## 2018-10-12 ENCOUNTER — Telehealth: Payer: Self-pay | Admitting: Urology

## 2018-10-12 ENCOUNTER — Ambulatory Visit
Admission: RE | Admit: 2018-10-12 | Discharge: 2018-10-12 | Disposition: A | Discharge status: Home / Self Care | Payer: Self-pay | Source: Ambulatory Visit | Attending: Urology | Admitting: Urology

## 2018-10-12 DIAGNOSIS — N201 Calculus of ureter: Secondary | ICD-10-CM

## 2018-10-12 DIAGNOSIS — N200 Calculus of kidney: Secondary | ICD-10-CM | POA: Insufficient documentation

## 2018-10-12 NOTE — Telephone Encounter (Signed)
Left pt mess and sent mychart 

## 2018-10-12 NOTE — Telephone Encounter (Signed)
Patient came to office today and drop off UA and went for KUB, spoke with patient and she states that her fever was only 100.3 not 103 as reported in after hours nurse line and states her fever is better now but still having right flank pain

## 2018-10-12 NOTE — Telephone Encounter (Signed)
Previously stone on KUB is not present to xray today.  If she is still haivng flank pain, lets get a renal ultrasound as soon as possible to assess for residual hydronephrosis.  Please let her know, order placed.  Vanna Scotland, MD

## 2018-10-15 LAB — CULTURE, URINE COMPREHENSIVE

## 2018-10-16 LAB — MICROSCOPIC EXAMINATION: RBC, Urine: >30 /hpf — AB (ref 0–2)

## 2018-10-16 LAB — URINALYSIS, COMPLETE
Bilirubin, UA: NEGATIVE
Glucose, UA: NEGATIVE
Ketones, UA: NEGATIVE
Leukocytes,UA: NEGATIVE
Nitrite, UA: NEGATIVE
Protein,UA: NEGATIVE
Specific Gravity, UA: 1.02 (ref 1.005–1.030)
Urobilinogen, Ur: 0.2 mg/dL (ref 0.2–1.0)
pH, UA: 5.5 (ref 5.0–7.5)

## 2018-10-19 ENCOUNTER — Other Ambulatory Visit: Payer: Self-pay | Admitting: Family Medicine

## 2018-10-19 DIAGNOSIS — F419 Anxiety disorder, unspecified: Secondary | ICD-10-CM

## 2018-10-26 ENCOUNTER — Other Ambulatory Visit: Payer: Self-pay | Admitting: Family Medicine

## 2018-10-26 DIAGNOSIS — E559 Vitamin D deficiency, unspecified: Secondary | ICD-10-CM

## 2018-10-26 NOTE — Progress Notes (Unsigned)
Pt called office and is feeling better.  She doesn't want to do any additional testing at this time.  She will call us if she needs [place].

## 2018-11-19 ENCOUNTER — Other Ambulatory Visit: Payer: Self-pay

## 2018-11-19 ENCOUNTER — Emergency Department
Admission: EM | Admit: 2018-11-19 | Discharge: 2018-11-20 | Disposition: A | Discharge status: Home / Self Care | Payer: Self-pay | Attending: Emergency Medicine | Admitting: Emergency Medicine

## 2018-11-19 DIAGNOSIS — N390 Urinary tract infection, site not specified: Secondary | ICD-10-CM | POA: Insufficient documentation

## 2018-11-19 DIAGNOSIS — F330 Major depressive disorder, recurrent, mild: Secondary | ICD-10-CM | POA: Insufficient documentation

## 2018-11-19 DIAGNOSIS — T402X1A Poisoning by other opioids, accidental (unintentional), initial encounter: Secondary | ICD-10-CM | POA: Insufficient documentation

## 2018-11-19 DIAGNOSIS — F17210 Nicotine dependence, cigarettes, uncomplicated: Secondary | ICD-10-CM | POA: Insufficient documentation

## 2018-11-19 DIAGNOSIS — T50901A Poisoning by unspecified drugs, medicaments and biological substances, accidental (unintentional), initial encounter: Secondary | ICD-10-CM

## 2018-11-19 DIAGNOSIS — E039 Hypothyroidism, unspecified: Secondary | ICD-10-CM | POA: Insufficient documentation

## 2018-11-19 DIAGNOSIS — Z79899 Other long term (current) drug therapy: Secondary | ICD-10-CM | POA: Insufficient documentation

## 2018-11-19 LAB — URINALYSIS, COMPLETE (UACMP) WITH MICROSCOPIC
Bilirubin Urine: NEGATIVE
Glucose, UA: NEGATIVE mg/dL
Hgb urine dipstick: NEGATIVE
Ketones, ur: NEGATIVE mg/dL
Nitrite: POSITIVE — AB
Protein, ur: NEGATIVE mg/dL
Specific Gravity, Urine: 1.013 (ref 1.005–1.030)
WBC, UA: >50 WBC/hpf — ABNORMAL HIGH (ref 0–5)
pH: 6 (ref 5.0–8.0)

## 2018-11-19 LAB — COMPREHENSIVE METABOLIC PANEL
ALT: 30 U/L (ref 0–44)
AST: 50 U/L — ABNORMAL HIGH (ref 15–41)
Albumin: 3.5 g/dL (ref 3.5–5.0)
Alkaline Phosphatase: 79 U/L (ref 38–126)
Anion gap: 11 (ref 5–15)
BUN: 15 mg/dL (ref 6–20)
CO2: 22 mmol/L (ref 22–32)
Calcium: 8.4 mg/dL — ABNORMAL LOW (ref 8.9–10.3)
Chloride: 108 mmol/L (ref 98–111)
Creatinine, Ser: 1.03 mg/dL — ABNORMAL HIGH (ref 0.44–1.00)
GFR calc Af Amer: >60 mL/min (ref >60)
GFR calc non Af Amer: >60 mL/min (ref >60)
Glucose, Bld: 235 mg/dL — ABNORMAL HIGH (ref 70–99)
Potassium: 4.2 mmol/L (ref 3.5–5.1)
Sodium: 141 mmol/L (ref 135–145)
Total Bilirubin: 0.1 mg/dL — ABNORMAL LOW (ref 0.3–1.2)
Total Protein: 6.4 g/dL — ABNORMAL LOW (ref 6.5–8.1)

## 2018-11-19 LAB — URINE DRUG SCREEN, QUALITATIVE (ARMC ONLY)
Amphetamines, Ur Screen: NOT DETECTED
Barbiturates, Ur Screen: NOT DETECTED
Benzodiazepine, Ur Scrn: POSITIVE — AB
Cannabinoid 50 Ng, Ur ~~LOC~~: POSITIVE — AB
Cocaine Metabolite,Ur ~~LOC~~: NOT DETECTED
MDMA (Ecstasy)Ur Screen: NOT DETECTED
Methadone Scn, Ur: NOT DETECTED
Opiate, Ur Screen: POSITIVE — AB
Phencyclidine (PCP) Ur S: NOT DETECTED
Tricyclic, Ur Screen: NOT DETECTED

## 2018-11-19 LAB — ETHANOL: Alcohol, Ethyl (B): <10 mg/dL (ref <10)

## 2018-11-19 LAB — CBC
HCT: 37.7 % (ref 36.0–46.0)
Hemoglobin: 11.1 g/dL — ABNORMAL LOW (ref 12.0–15.0)
MCH: 27.9 pg (ref 26.0–34.0)
MCHC: 29.4 g/dL — ABNORMAL LOW (ref 30.0–36.0)
MCV: 94.7 fL (ref 80.0–100.0)
Platelets: 225 10*3/uL (ref 150–400)
RBC: 3.98 MIL/uL (ref 3.87–5.11)
RDW: 14.6 % (ref 11.5–15.5)
WBC: 10.7 10*3/uL — ABNORMAL HIGH (ref 4.0–10.5)
nRBC: 0 % (ref 0.0–0.2)

## 2018-11-19 LAB — ACETAMINOPHEN LEVEL: Acetaminophen (Tylenol), Serum: <10 ug/mL — ABNORMAL LOW (ref 10–30)

## 2018-11-19 LAB — SALICYLATE LEVEL: Salicylate Lvl: <7 mg/dL (ref 2.8–30.0)

## 2018-11-19 LAB — POCT PREGNANCY, URINE: Preg Test, Ur: NEGATIVE

## 2018-11-19 MED ORDER — SODIUM CHLORIDE 0.9 % IV BOLUS
1000.0000 mL | Freq: Once | INTRAVENOUS | Status: AC
  Administered 2018-11-19: 18:00:00 1000 mL via INTRAVENOUS

## 2018-11-19 MED ORDER — NITROFURANTOIN MONOHYD MACRO 100 MG PO CAPS: 100.0000 mg | ORAL_CAPSULE | Freq: Two times a day (BID) | ORAL | 0 refills | Status: AC

## 2018-11-19 NOTE — ED Notes (Signed)
Vernard Gambles at [phone] Called in and spoke with this RN and reported that he is her boyfriend and is fearful that she was attempting to kill herself by overdosing, that she has done this in the past before, that she is very depressed and still going through a bad divorce and one of her children will not speak to her. He thinks that she may have taken his morphine and that it would have only been 4-5 tablets and possibly xanax, but also states that a friend of hers stopped by today and gave her something and he thinks it could have contributed to her condition.

## 2018-11-19 NOTE — ED Provider Notes (Signed)
[facility] Emergency Department Provider Note   ____________________________________________   I have reviewed the triage vital signs and the nursing notes.   HISTORY  Chief Complaint Drug Overdose   History limited by and level 5 caveat due to: Drug intoxication  HPI Destiny AmmonsJennifer K Spang is a 43 y.o. female who presents to the emergency department today after overdose. The patient apparently was found having agonal breathing by family. 911 did administer narcan upon arrival to the scene. Per report morphine tablets were found on the seen. Patient herself is awake but mumbling.   Records reviewed. Per medical record review patient has a history of polysubstance abuse. ER visit for drug abuse in the past.   Past Medical History:  Diagnosis Date  . Anxiety   . Depression   . Hypothyroidism   . Kidney stones   . PTSD (post-traumatic stress disorder)   . UTI (urinary tract infection)     Patient Active Problem List   Diagnosis Date Noted  . Delirium 08/25/2018  . Benzodiazepine abuse (HCC) 08/25/2018  . History of suicide attempt 07/31/2018  . Anxiety 07/31/2018  . Neck pain 07/31/2018  . Tingling of upper extremity 07/31/2018  . Cocaine use disorder, moderate, dependence (HCC) 06/02/2016  . Self-inflicted laceration of wrist 06/02/2016  . Tobacco use disorder 06/17/2015  . PTSD (post-traumatic stress disorder) 06/17/2015  . Severe recurrent major depression without psychotic features (HCC) 06/16/2015  . Opiate abuse, episodic (HCC) 06/16/2015  . Alcohol use disorder, mild, abuse 06/16/2015  . Suicide attempt (HCC) 06/16/2015  . Hypothyroidism 06/16/2015    Past Surgical History:  Procedure Laterality Date  . CESAREAN SECTION    . CHOLECYSTECTOMY    . CHOLECYSTECTOMY    . EXTRACORPOREAL SHOCK WAVE LITHOTRIPSY Right 09/28/2018   Procedure: EXTRACORPOREAL SHOCK WAVE LITHOTRIPSY (ESWL);  Surgeon: Vanna ScotlandBrandon, Ashley, MD;  Location: [facility] ORS;  Service:  Urology;  Laterality: Right;  . SHOULDER SURGERY Right     Prior to Admission medications   Medication Sig Start Date End Date Taking? Authorizing Provider  ciprofloxacin (CIPRO) 500 MG tablet Take 1 tablet (500 mg total) by mouth 2 (two) times daily. 10/10/18   Stoioff, Verna CzechScott C, MD  cloNIDine (CATAPRES) 0.1 MG tablet TAKE 1 TABLET BY MOUTH TWICE A DAY 10/20/18   Guse, Janna ArchLauren M, FNP  HYDROcodone-acetaminophen (NORCO) 5-325 MG tablet Take 1 tablet by mouth every 4 (four) hours as needed for moderate pain. 09/29/18   Crista ElliotBell, Eugene D III, MD  HYDROmorphone (DILAUDID) 2 MG tablet Take 1 tablet (2 mg total) by mouth every 6 (six) hours as needed for severe pain. 09/29/18   Crista ElliotBell, Eugene D III, MD  hydrOXYzine (ATARAX/VISTARIL) 10 MG tablet Take 1 tablet (10 mg total) by mouth 3 (three) times daily as needed for anxiety. Patient not taking: Reported on 08/25/2018 08/14/18   Tracey HarriesGuse, Lauren M, FNP  levothyroxine (SYNTHROID, LEVOTHROID) 25 MCG tablet Take 1 tablet (25 mcg total) by mouth daily before breakfast. 08/02/18   Guse, Janna ArchLauren M, FNP  naproxen sodium (ALEVE) 220 MG tablet Take 220 mg by mouth.    [provider]  ondansetron (ZOFRAN ODT) 4 MG disintegrating tablet Take 1 tablet (4 mg total) by mouth every 6 (six) hours as needed for nausea or vomiting. 08/31/18   Sharyn CreamerQuale, Mark, MD  ondansetron (ZOFRAN) 4 MG tablet Take 1 tablet (4 mg total) by mouth every 8 (eight) hours as needed for nausea or vomiting. 09/28/18   Vanna ScotlandBrandon, Ashley, MD  oxyCODONE (ROXICODONE)  5 MG immediate release tablet Take 1-2 tablets (5-10 mg total) by mouth every 6 (six) hours as needed for severe pain or breakthrough pain. 09/25/18   Sharman CheekStafford, Phillip, MD  predniSONE (DELTASONE) 20 MG tablet Take 2 tablets (40 mg total) by mouth daily with breakfast. Patient not taking: Reported on 08/25/2018 08/18/18   Sharyn CreamerQuale, Mark, MD  tamsulosin (FLOMAX) 0.4 MG CAPS capsule Take 1 capsule (0.4 mg total) by mouth daily. 09/25/18   Sharman CheekStafford, Phillip, MD   Vitamin D, Ergocalciferol, (DRISDOL) 1.25 MG (50000 UT) CAPS capsule TAKE 1 CAPSULE (50,000 UNITS TOTAL) BY MOUTH EVERY 7 DAYS 10/27/18   Tracey HarriesGuse, Lauren M, FNP    Allergies Keflex [cephalexin], Sulfa antibiotics, Tramadol, and Tylenol [acetaminophen]  Family History  Problem Relation Age of Onset  . COPD Mother   . Depression Mother   . Early death Mother   . Hypertension Mother   . Mental illness Mother   . Heart disease Father   . Heart attack Father   . Hyperlipidemia Father   . Hypertension Father   . Breast cancer Maternal Grandmother   . Depression Maternal Grandmother   . Cancer Paternal Grandmother   . Birth defects Paternal Grandmother     Social History Social History   Tobacco Use  . Smoking status: Current Every Day Smoker    Packs/day: 0.50    Types: Cigarettes  . Smokeless tobacco: Never Used  Substance Use Topics  . Alcohol use: Yes    Comment: socially  . Drug use: Not Currently    Comment: every now and then    Review of Systems Unable to obtain reliable ROS secondary to drug intoxication ____________________________________________   PHYSICAL EXAM:  VITAL SIGNS: ED Triage Vitals  Enc Vitals Group     BP 11/19/18 1702 (!) 111/54     Pulse Rate 11/19/18 1702 89     Resp 11/19/18 1702 14     Temp 11/19/18 1702 (!) 96.2 F (35.7 C)     Temp Source 11/19/18 1702 Rectal     SpO2 11/19/18 1702 100 %     Weight 11/19/18 1711 200 lb (90.7 kg)     Height 11/19/18 1711 5\' 5"  (1.651 m)     Head Circumference --      Peak Flow --      Pain Score 11/19/18 1710 0   Constitutional: Awake and alert. Mumbling.  Eyes: Conjunctivae are normal.  ENT      Head: Normocephalic and atraumatic.      Nose: No congestion/rhinnorhea.      Mouth/Throat: Mucous membranes are moist.      Neck: No stridor. Hematological/Lymphatic/Immunilogical: No cervical lymphadenopathy. Cardiovascular: Normal rate, regular rhythm.  No murmurs, rubs, or gallops.  Respiratory:  Normal respiratory effort without tachypnea nor retractions. Breath sounds are clear and equal bilaterally. No wheezes/rales/rhonchi. Gastrointestinal: Soft and non tender. No rebound. No guarding.  Genitourinary: Deferred Musculoskeletal: Normal range of motion in all extremities. No lower extremity edema. Neurologic:  Awake, alert. Mumbling.  Skin:  Skin is warm, dry and intact. No rash noted. ____________________________________________    LABS (pertinent positives/negatives)  Upreg negative CBC wbc 10.7, hgb 11.1, plt 225 Acetaminophen, salicylate, ethanol below threshold CMP na 141, k 4.2, glu 235, cr 1.03  ____________________________________________   EKG  I, Phineas SemenGraydon Terrill Alperin, attending physician, personally viewed and interpreted this EKG  EKG Time: 1656 Rate: 95 Rhythm: sinus rhythm Axis: normal Intervals: qtc 493 QRS: narrow ST changes: no st elevation, t wave inversion  III Impression: abnormal ekg   ____________________________________________    RADIOLOGY  None  ____________________________________________   PROCEDURES  Procedures  ____________________________________________   INITIAL IMPRESSION / ASSESSMENT AND PLAN / ED COURSE  Pertinent labs & imaging results that were available during my care of the patient were reviewed by me and considered in my medical decision making (see chart for details).   Patient presented to the emergency department today after opioid overdose.  Patient did get Narcan on scene.  Initially patient was not able to give any history.  She did however become more awake and alert throughout her stay here in the emergency department.  She denied any intention for self-harm.  Patient was evaluated by psychiatry.  I spoke to Dr. Lovena Neighbours with psychiatry.  He also spoke to the patient feels that she did not have any intention of self-harm.  He did rescind the IVC.  Discussed with patient importance of proper pain management.  In  addition patient's urine is consistent with urinary tract action.  Will prescribe antibiotics.  ____________________________________________   FINAL CLINICAL IMPRESSION(S) / ED DIAGNOSES  Final diagnoses:  Accidental drug overdose, initial encounter  Lower urinary tract infection     Note: This dictation was prepared with Dragon dictation. Any transcriptional errors that result from this process are unintentional     Nance Pear, MD 11/19/18 2302

## 2018-11-19 NOTE — Discharge Instructions (Addendum)
Please seek medical attention and help for any thoughts about wanting to harm yourself, harm others, any concerning change in behavior, severe depression, inappropriate drug use or any other new or concerning symptoms. ° °

## 2018-11-19 NOTE — ED Triage Notes (Signed)
Pt found unresponsive with agonal breathing by family member who called 43, pt was found on the living room floor with her pants down to her knees, morphine tablets of 15mg  found on the floor near her. [place] [facility] administered 2mg  of narcan intranasally and then was administered 2 more mg of narcan by ems intranasllay  It was reported that the pt may have taken 15-16 tablets, last spoken with by family member an hour prior to ems arrival on the scene Initial vs for ems 88/47, then 99/74 with agonal breathing. Pt is opening and closing her eyes spontaneously at this time. Pt unable to make words but is mumbling when asked questions

## 2018-11-19 NOTE — ED Notes (Signed)
Dr Philis Pique called in to do [facility], report given

## 2019-01-03 ENCOUNTER — Ambulatory Visit: Payer: Self-pay

## 2019-01-10 ENCOUNTER — Ambulatory Visit: Payer: Self-pay

## 2019-01-22 ENCOUNTER — Ambulatory Visit: Payer: Self-pay

## 2019-10-31 ENCOUNTER — Other Ambulatory Visit: Payer: Self-pay

## 2019-10-31 ENCOUNTER — Emergency Department
Admission: EM | Admit: 2019-10-31 | Discharge: 2019-10-31 | Disposition: A | Discharge status: Home / Self Care | Payer: Self-pay | Attending: Emergency Medicine | Admitting: Emergency Medicine

## 2019-10-31 ENCOUNTER — Emergency Department: Payer: Self-pay

## 2019-10-31 DIAGNOSIS — Y92481 Parking lot as the place of occurrence of the external cause: Secondary | ICD-10-CM | POA: Insufficient documentation

## 2019-10-31 DIAGNOSIS — Z79899 Other long term (current) drug therapy: Secondary | ICD-10-CM | POA: Insufficient documentation

## 2019-10-31 DIAGNOSIS — W010XXA Fall on same level from slipping, tripping and stumbling without subsequent striking against object, initial encounter: Secondary | ICD-10-CM | POA: Insufficient documentation

## 2019-10-31 DIAGNOSIS — E039 Hypothyroidism, unspecified: Secondary | ICD-10-CM | POA: Insufficient documentation

## 2019-10-31 DIAGNOSIS — S8391XA Sprain of unspecified site of right knee, initial encounter: Secondary | ICD-10-CM | POA: Insufficient documentation

## 2019-10-31 DIAGNOSIS — Y999 Unspecified external cause status: Secondary | ICD-10-CM | POA: Insufficient documentation

## 2019-10-31 DIAGNOSIS — F17210 Nicotine dependence, cigarettes, uncomplicated: Secondary | ICD-10-CM | POA: Insufficient documentation

## 2019-10-31 DIAGNOSIS — Y9301 Activity, walking, marching and hiking: Secondary | ICD-10-CM | POA: Insufficient documentation

## 2019-10-31 MED ORDER — LORAZEPAM 1 MG PO TABS
1.0000 mg | ORAL_TABLET | Freq: Once | ORAL | Status: AC
  Administered 2019-10-31: 1 mg via ORAL
  Filled 2019-10-31: qty 1

## 2019-10-31 MED ORDER — OXYCODONE HCL 5 MG PO TABS
5.0000 mg | ORAL_TABLET | Freq: Once | ORAL | Status: AC
  Administered 2019-10-31: 5 mg via ORAL
  Filled 2019-10-31: qty 1

## 2019-10-31 MED ORDER — IBUPROFEN 600 MG PO TABS
600.0000 mg | ORAL_TABLET | Freq: Once | ORAL | Status: AC
  Administered 2019-10-31: 600 mg via ORAL
  Filled 2019-10-31: qty 1

## 2019-10-31 MED ORDER — OXYCODONE HCL 5 MG PO TABS: 5.0000 mg | ORAL_TABLET | Freq: Three times a day (TID) | ORAL | 0 refills | Status: DC | PRN

## 2019-10-31 MED ORDER — MELOXICAM 15 MG PO TABS: 15.0000 mg | ORAL_TABLET | Freq: Every day | ORAL | 0 refills | Status: DC

## 2019-10-31 NOTE — ED Triage Notes (Signed)
Pt states she stepped in a hole on the ground outside of [employer] and is now co right knee pain and low back pain.

## 2019-11-01 NOTE — ED Provider Notes (Addendum)
[facility] Emergency Department Provider Note  ____________________________________________  Time seen: Approximately 12:17 AM  I have reviewed the triage vital signs and the nursing notes.   HISTORY  Chief Complaint Knee Injury    HPI Destiny Myers is a 44 y.o. female who presents to the emergency department complaining of right knee pain.  Patient states that she was meeting her sister in [place] to eat dinner.  She states that she had parked in one parking lot of a restaurant, her sister was parked and the other.  Patient stepped into the grassy area between 2 parking lot and stepped into a hole.  Patient states that she "wrenched" her knee and fell to the ground.  Patient's complaint is right knee pain.  She states that she has had pain, swelling, inability to bear weight.  No other reported trauma.  Patient states that initially right knee pain was all she experience but she is now experiencing some aching in her lower back.  Patient does have a history of low back pain.  Patient states that she has had multiple problems with her right knee in the past.  No medications prior to arrival.         Past Medical History:  Diagnosis Date  . Anxiety   . Depression   . Hypothyroidism   . Kidney stones   . PTSD (post-traumatic stress disorder)   . UTI (urinary tract infection)     Patient Active Problem List   Diagnosis Date Noted  . Delirium 08/25/2018  . Benzodiazepine abuse (HCC) 08/25/2018  . History of suicide attempt 07/31/2018  . Anxiety 07/31/2018  . Neck pain 07/31/2018  . Tingling of upper extremity 07/31/2018  . Cocaine use disorder, moderate, dependence (HCC) 06/02/2016  . Self-inflicted laceration of wrist (HCC) 06/02/2016  . Tobacco use disorder 06/17/2015  . PTSD (post-traumatic stress disorder) 06/17/2015  . Severe recurrent major depression without psychotic features (HCC) 06/16/2015  . Opiate abuse, episodic (HCC) 06/16/2015  .  Alcohol use disorder, mild, abuse 06/16/2015  . Suicide attempt (HCC) 06/16/2015  . Hypothyroidism 06/16/2015    Past Surgical History:  Procedure Laterality Date  . CESAREAN SECTION    . CHOLECYSTECTOMY    . CHOLECYSTECTOMY    . EXTRACORPOREAL SHOCK WAVE LITHOTRIPSY Right 09/28/2018   Procedure: EXTRACORPOREAL SHOCK WAVE LITHOTRIPSY (ESWL);  Surgeon: Vanna Scotland, MD;  Location: [facility] ORS;  Service: Urology;  Laterality: Right;  . SHOULDER SURGERY Right     Prior to Admission medications   Medication Sig Start Date End Date Taking? Authorizing Provider  ciprofloxacin (CIPRO) 500 MG tablet Take 1 tablet (500 mg total) by mouth 2 (two) times daily. 10/10/18   Stoioff, Verna Czech, MD  cloNIDine (CATAPRES) 0.1 MG tablet TAKE 1 TABLET BY MOUTH TWICE A DAY 10/20/18   Guse, Janna Arch, FNP  HYDROcodone-acetaminophen (NORCO) 5-325 MG tablet Take 1 tablet by mouth every 4 (four) hours as needed for moderate pain. 09/29/18   Crista Elliot, MD  HYDROmorphone (DILAUDID) 2 MG tablet Take 1 tablet (2 mg total) by mouth every 6 (six) hours as needed for severe pain. 09/29/18   Crista Elliot, MD  hydrOXYzine (ATARAX/VISTARIL) 10 MG tablet Take 1 tablet (10 mg total) by mouth 3 (three) times daily as needed for anxiety. Patient not taking: Reported on 08/25/2018 08/14/18   Tracey Harries, FNP  levothyroxine (SYNTHROID, LEVOTHROID) 25 MCG tablet Take 1 tablet (25 mcg total) by mouth daily before breakfast. 08/02/18  Tracey Harries, FNP  meloxicam (MOBIC) 15 MG tablet Take 1 tablet (15 mg total) by mouth daily. 10/31/19   Mehek Grega, Delorise Royals, PA-C  naproxen sodium (ALEVE) 220 MG tablet Take 220 mg by mouth.    [provider]  ondansetron (ZOFRAN ODT) 4 MG disintegrating tablet Take 1 tablet (4 mg total) by mouth every 6 (six) hours as needed for nausea or vomiting. 08/31/18   Sharyn Creamer, MD  ondansetron (ZOFRAN) 4 MG tablet Take 1 tablet (4 mg total) by mouth every 8 (eight) hours as needed for  nausea or vomiting. 09/28/18   Vanna Scotland, MD  oxyCODONE (ROXICODONE) 5 MG immediate release tablet Take 1 tablet (5 mg total) by mouth every 8 (eight) hours as needed. 10/31/19 10/30/20  Aly Seidenberg, Delorise Royals, PA-C  predniSONE (DELTASONE) 20 MG tablet Take 2 tablets (40 mg total) by mouth daily with breakfast. Patient not taking: Reported on 08/25/2018 08/18/18   Sharyn Creamer, MD  tamsulosin (FLOMAX) 0.4 MG CAPS capsule Take 1 capsule (0.4 mg total) by mouth daily. 09/25/18   Sharman Cheek, MD  Vitamin D, Ergocalciferol, (DRISDOL) 1.25 MG (50000 UT) CAPS capsule TAKE 1 CAPSULE (50,000 UNITS TOTAL) BY MOUTH EVERY 7 DAYS 10/27/18   Tracey Harries, FNP    Allergies Keflex [cephalexin], Sulfa antibiotics, Tramadol, and Tylenol [acetaminophen]  Family History  Problem Relation Age of Onset  . COPD Mother   . Depression Mother   . Early death Mother   . Hypertension Mother   . Mental illness Mother   . Heart disease Father   . Heart attack Father   . Hyperlipidemia Father   . Hypertension Father   . Breast cancer Maternal Grandmother   . Depression Maternal Grandmother   . Cancer Paternal Grandmother   . Birth defects Paternal Grandmother     Social History Social History   Tobacco Use  . Smoking status: Current Every Day Smoker    Packs/day: 0.50    Types: Cigarettes  . Smokeless tobacco: Never Used  Substance Use Topics  . Alcohol use: Yes    Comment: socially  . Drug use: Not Currently    Comment: every now and then     Review of Systems  Constitutional: No fever/chills Eyes: No visual changes. No discharge ENT: No upper respiratory complaints. Cardiovascular: no chest pain. Respiratory: no cough. No SOB. Gastrointestinal: No abdominal pain.  No nausea, no vomiting.  No diarrhea.  No constipation. Musculoskeletal: Positive for right knee pain Skin: Negative for rash, abrasions, lacerations, ecchymosis. Neurological: Negative for headaches, focal weakness or  numbness. 10-point ROS otherwise negative.  ____________________________________________   PHYSICAL EXAM:  VITAL SIGNS: ED Triage Vitals  Enc Vitals Group     BP 10/31/19 2010 (!) 156/92     Pulse Rate 10/31/19 2010 (!) 111     Resp 10/31/19 2010 18     Temp 10/31/19 2010 98.5 F (36.9 C)     Temp Source 10/31/19 2010 Oral     SpO2 10/31/19 2010 100 %     Weight 10/31/19 2012 190 lb (86.2 kg)     Height 10/31/19 2012 5\' 4"  (1.626 m)     Head Circumference --      Peak Flow --      Pain Score 10/31/19 2015 7     Pain Loc --      Pain Edu? --      Excl. in GC? --      Constitutional: Alert and oriented. Well  appearing and in no acute distress. Eyes: Conjunctivae are normal. PERRL. EOMI. Head: Atraumatic. ENT:      Ears:       Nose: No congestion/rhinnorhea.      Mouth/Throat: Mucous membranes are moist.  Neck: No stridor.    Cardiovascular: Normal rate, regular rhythm. Normal S1 and S2.  Good peripheral circulation. Respiratory: Normal respiratory effort without tachypnea or retractions. Lungs CTAB. Good air entry to the bases with no decreased or absent breath sounds. Musculoskeletal: Full range of motion to all extremities. No gross deformities appreciated.  Visualization of the right knee reveals edema on the anterior aspect.  No erythema.  No abrasions or lacerations.  No deformity.  Limited range of motion due to pain.  Patient is tender to palpation diffusely over the patella, bilateral joint lines.  Mild palpable edema is appreciated but no significant ballottement.  Varus, valgus, Lachman's, McMurray's is negative.  Dorsalis pedis pulse and sensation intact distally.  Examination of the right hip and ankle is unremarkable. Neurologic:  Normal speech and language. No gross focal neurologic deficits are appreciated.  Skin:  Skin is warm, dry and intact. No rash noted. Psychiatric: Mood and affect are normal. Speech and behavior are normal. Patient exhibits appropriate  insight and judgement.   ____________________________________________   LABS (all labs ordered are listed, but only abnormal results are displayed)  Labs Reviewed - No data to display ____________________________________________  EKG   ____________________________________________  RADIOLOGY I personally viewed and evaluated these images as part of my medical decision making, as well as reviewing the written report by the radiologist.  DG Knee Complete 4 Views Right  Result Date: 10/31/2019 CLINICAL DATA:  Right knee pain after injury EXAM: RIGHT KNEE - COMPLETE 4+ VIEW COMPARISON:  06/26/2017 FINDINGS: Frontal, bilateral oblique, lateral views of the right knee demonstrate 3 compartmental osteoarthritis greatest in the patellofemoral compartment, progressive since prior exam. No fracture, subluxation, or dislocation. No joint effusion. IMPRESSION: 1. Progressive 3 compartmental osteoarthritis. No acute bony abnormality. Electronically Signed   By: Sharlet Salina M.D.   On: 10/31/2019 20:53    ____________________________________________    PROCEDURES  Procedure(s) performed:    Procedures    Medications  ibuprofen (ADVIL) tablet 600 mg (600 mg Oral Given 10/31/19 2204)  oxyCODONE (Oxy IR/ROXICODONE) immediate release tablet 5 mg (5 mg Oral Given 10/31/19 2204)  LORazepam (ATIVAN) tablet 1 mg (1 mg Oral Given 10/31/19 2204)     ____________________________________________   INITIAL IMPRESSION / ASSESSMENT AND PLAN / ED COURSE  Pertinent labs & imaging results that were available during my care of the patient were reviewed by me and considered in my medical decision making (see chart for details).  Review of the  [place] was performed in accordance of the [facility] prior to dispensing any controlled drugs.           Patient's diagnosis is consistent with right knee sprain.  Patient presented to emergency department after stepping into a hole this evening.  Patient did  have some edema about the knee but imaging is reassuring with no evidence of fracture or dislocation.  Patient does have evidence of osteoarthritis to the knee.  Patient will be placed in knee immobilizer, crutches for ambulation.  Patient will be provided anti-inflammatory and very limited prescription for pain medication.  Follow-up with orthopedics. Patient is given ED precautions to return to the ED for any worsening or new symptoms.   Addendum: Patient requested that we addend the medical record to show  that the patient and her sister were in the same car and had traveled to [employer] to eat dinner.  Patient states that after she had eaten, she was walking back to the car to get into the car.  The area that she had to walk through to get to the passenger door was the grassy area in question.  Patient states that this was where the hole that she stepped into was located and she subsequently fell from this hole.  This caused patient to fall forward, ultimately injuring her knee.  Diagnosis remains the same, however patient would like the medical record addended to show that she and her sister were traveling in the same vehicle, and the grassy strip next to the vehicle, specifically the passenger side of the vehicle, where she had to walk through to get into the vehicle was where this hole was located.  ____________________________________________  FINAL CLINICAL IMPRESSION(S) / ED DIAGNOSES  Final diagnoses:  Sprain of right knee, unspecified ligament, initial encounter      NEW MEDICATIONS STARTED DURING THIS VISIT:  ED Discharge Orders         Ordered    oxyCODONE (ROXICODONE) 5 MG immediate release tablet  Every 8 hours PRN     10/31/19 2255    meloxicam (MOBIC) 15 MG tablet  Daily     10/31/19 2255              This chart was dictated using voice recognition software/Dragon. Despite best efforts to proofread, errors can occur which can change the meaning. Any change was purely  unintentional.    Darletta Moll, PA-C 11/01/19 0057    Drenda Freeze, MD 11/02/19 2106    Guerry Minors Charline Bills, PA-C 02/28/20 1940    Drenda Freeze, MD 02/28/20 1949

## 2020-01-14 DIAGNOSIS — M545 Low back pain: Secondary | ICD-10-CM | POA: Diagnosis not present

## 2020-09-02 ENCOUNTER — Telehealth: Payer: Self-pay | Admitting: Family Medicine

## 2020-09-02 NOTE — Telephone Encounter (Signed)
Phone call to pt. Left message that RN with [facility] is returning a phone call, not sure if she was trying to reach [place] or another provider. It does not appear that we have had her as a pt, so not sure if she was trying to get the health dept or another provider. Please call us back if still need assistance at [phone].  (Cannot find a record for [facility] in epic or [facility]).

## 2020-09-02 NOTE — Telephone Encounter (Signed)
I have questions and concerns about My IUD that came out and needed to know why Im having periods back to back.

## 2020-09-03 NOTE — Telephone Encounter (Signed)
Phone call to pt. Left message that [facility] is returning phone, not sure if she was trying to reach [place] or another provider, if trying to reach [facility] can call [phone] if still need assistance.

## 2020-09-03 NOTE — Telephone Encounter (Signed)
Pt want new IUD placed. IUD fell out about a month and a half ago. Had IUD placed about 1 year and 3 months ago (done at [facility]). IUD for really only for cramping and periods. Had Mirena with no problems except expulsion. Has not had any live sperm in her body since expulsion. Last physical was at time of her last Mirena placement. Is still capable of having children, no BTL. No insurance. Pt scheduled for 09/15/20 physical and IUD insertion. Pt will continue to abstain from sex prior to appt

## 2020-09-15 ENCOUNTER — Ambulatory Visit: Payer: Self-pay

## 2020-09-15 ENCOUNTER — Encounter: Payer: Self-pay | Admitting: Family Medicine

## 2020-09-15 ENCOUNTER — Ambulatory Visit (LOCAL_COMMUNITY_HEALTH_CENTER): Payer: Self-pay | Admitting: Family Medicine

## 2020-09-15 ENCOUNTER — Other Ambulatory Visit: Payer: Self-pay

## 2020-09-15 VITALS — BP 142/84 | Ht 64.0 in | Wt 173.0 lb

## 2020-09-15 DIAGNOSIS — Z113 Encounter for screening for infections with a predominantly sexual mode of transmission: Secondary | ICD-10-CM

## 2020-09-15 DIAGNOSIS — F1190 Opioid use, unspecified, uncomplicated: Secondary | ICD-10-CM

## 2020-09-15 DIAGNOSIS — B9689 Other specified bacterial agents as the cause of diseases classified elsewhere: Secondary | ICD-10-CM

## 2020-09-15 DIAGNOSIS — F32A Depression, unspecified: Secondary | ICD-10-CM

## 2020-09-15 DIAGNOSIS — Z3009 Encounter for other general counseling and advice on contraception: Secondary | ICD-10-CM

## 2020-09-15 DIAGNOSIS — Z01419 Encounter for gynecological examination (general) (routine) without abnormal findings: Secondary | ICD-10-CM

## 2020-09-15 DIAGNOSIS — Z30430 Encounter for insertion of intrauterine contraceptive device: Secondary | ICD-10-CM

## 2020-09-15 LAB — WET PREP FOR TRICH, YEAST, CLUE
Trichomonas Exam: NEGATIVE
Yeast Exam: NEGATIVE

## 2020-09-15 MED ORDER — LEVONORGESTREL 20 MCG/24HR IU IUD: INTRAUTERINE_SYSTEM | Freq: Once | INTRAUTERINE | Status: AC

## 2020-09-15 MED ORDER — METRONIDAZOLE 500 MG PO TABS: 500.0000 mg | ORAL_TABLET | Freq: Two times a day (BID) | ORAL | 0 refills | Status: AC

## 2020-09-15 NOTE — Progress Notes (Signed)
[facility] Number: [phone]   Family Planning Visit- Initial Visit  Subjective:  Destiny Myers is a 45 y.o.  G2P2002   being seen today for an initial annual visit and to discuss contraceptive options.  The patient is currently using IUD Mirena - explused about 3 weeks ago and has not had sex since. Patient reports she does not want a pregnancy in the next year.  Patient has the following medical conditions has Severe recurrent major depression without psychotic features ([place]); Opiate abuse, episodic ([place]); Alcohol use disorder, mild, abuse; Suicide attempt ([place]); Hypothyroidism; Tobacco use disorder; PTSD (post-traumatic stress disorder); Cocaine use disorder, moderate, dependence ([place]); Self-inflicted laceration of wrist ([place]); History of suicide attempt; Anxiety; Neck pain; Tingling of upper extremity; Delirium; and Benzodiazepine abuse ([place]) on their problem list.  Chief Complaint  Patient presents with  . Annual Exam  . Contraception    Desires Mirena IUD    Patient desires IUD placement for contraception.   We discussed her history and she opened up about her substance use and mental health.   OUD: Patient has longstanding history of opiate use and has been on methadone in the past. She can no longer pay for the methadone and she has a friend that has been helping and giving her morphine 50m IR and morphine 374mXR. She uses these to prevent nausea/vomitting and to control her pain. She has used injected opiates in the past.  Reports known history of HCV positive antibody in the past and was viral load negative at that time per her report.  Depression: elevated PHQ9 today (17) and longstanding depression and anxiety.  [facility] has not been able to see them due to losing charity care.   Vaginal odor: reports a new vaginal odor. Denies recent penetrative sex and only having oral sex  with partner. Denies discharge, itching or discomfort.   See ROS- nausea and joint pain are chronic per patient.   Patient denies known breast or uterine cancer.   Body mass index is 29.7 kg/m. - Patient is eligible for diabetes screening based on BMI and age >4>42 no HA1C ordered? no  Patient reports 1  partner/s in last year. Desires STI screening?  Yes  Has patient been screened once for HCV in the past?  Yes- known positive but does use non-prescribed opiates currently.   No results found for: HCVAB  Does the patient have current drug use (including MJ), have a partner with drug use, and/or has been incarcerated since last result? Yes  If yes-- [facility]   Does the patient meet criteria for HBV testing? Yes  Criteria:  -Household, sexual or needle sharing contact with HBV -History of drug use -HIV positive -Those with known Hep C   Health Maintenance Due  Topic Date Due  . Hepatitis C Screening  Never done  . COVID-19 Vaccine (1) Never done  . HIV Screening  Never done  . TETANUS/TDAP  Never done    Review of Systems  Constitutional: Negative for chills and fever.  Eyes: Negative for blurred vision and double vision.  Respiratory: Negative for cough and shortness of breath.   Cardiovascular: Negative for chest pain and orthopnea.  Gastrointestinal: Positive for nausea. Negative for vomiting.  Genitourinary: Negative for dysuria, flank pain and frequency.  Musculoskeletal: Positive for joint pain. Negative for myalgias.  Skin: Negative for rash.  Neurological:  Negative for dizziness, tingling, weakness and headaches.  Endo/Heme/Allergies: Does not bruise/bleed easily.  Psychiatric/Behavioral: Negative for depression and suicidal ideas. The patient is not nervous/anxious.     The following portions of the patient's history were reviewed and updated as appropriate: allergies, current medications, past family history, past medical history,  past social history, past surgical history and problem list. Problem list updated.   See flowsheet for other program required questions.  Objective:   Vitals:   09/15/20 1457 09/15/20 1518  BP: (!) 141/98 (!) 142/84  Weight: 173 lb (78.5 kg)   Height: 5' 4" (1.626 m)     Physical Exam Constitutional:      Appearance: Normal appearance.  HENT:     Head: Normocephalic and atraumatic.  Pulmonary:     Effort: Pulmonary effort is normal.  Abdominal:     Palpations: Abdomen is soft.  Musculoskeletal:        General: Normal range of motion.  Skin:    General: Skin is warm and dry.  Neurological:     General: No focal deficit present.     Mental Status: She is alert.  Psychiatric:        Mood and Affect: Mood normal.        Behavior: Behavior normal.    Patient presented to [facility] for IUD insertion. Her GC/CT screening was found to be up to date and using WHO criteria we can be reasonably certain she is not pregnant or a pregnancy test was obtained which was   See Flowsheet for IUD check list  IUD Insertion Procedure Note Patient identified, informed consent performed, consent signed.   Discussed risks of irregular bleeding, cramping, infection, malpositioning or misplacement of the IUD outside the uterus which may require further procedure such as laparoscopy. Time out was performed.    Speculum placed in the vagina.  Cervix visualized.  Cleaned with Betadine x 2.  Grasped anteriorly with a single tooth tenaculum. Significant internal os spasm, used plastic and metal sound. Was eventually able to enter the uterus.  Uterus sounded to 9 cm.  IUD placed per manufacturer's recommendations.  Strings trimmed to 3 cm.  Tenaculum was removed, good hemostasis noted.  Patient tolerated procedure well.   Assessment and Plan:  [name] is a 45 y.o. female presenting to the [facility] for an initial annual wellness/contraceptive visit  Contraception  counseling: Reviewed all forms of birth control options in the tiered based approach. available including abstinence; over the counter/barrier methods; hormonal contraceptive medication including pill, patch, ring, injection,contraceptive implant, ECP; hormonal and nonhormonal IUDs; permanent sterilization options including vasectomy and the various tubal sterilization modalities. Risks, benefits, and typical effectiveness rates were reviewed.  Questions were answered.  Written information was also given to the patient to review.  Patient desires IUD, this was prescribed for patient. She will follow up in  1 yr for surveillance.  She was told to call with any further questions, or with any concerns about this method of contraception.  Emphasized use of condoms 100% of the time for STI prevention.  1. Well woman exam with routine gynecological exam Breast exam deferred. Please give [facility]CP brochure  Immunization- [facility] was unavailable. Patient does not remember last Td but not interested in any immunization today Pap is up to date- reviewed in care everywhere  2. Encounter for IUD insertion Patient was given post-procedure instructions- both agency handout and verbally by provider.  She was advised to have backup contraception for one  week.  Patient was also asked to check IUD strings periodically or follow up in 4 weeks for IUD check. - levonorgestrel (MIRENA) 20 MCG/24HR IUD  3. Screening examination for venereal disease - HIV/HCV [facility] - Syphilis Serology, [facility] - Chlamydia/Gonorrhea [facility] - WET PREP FOR [place], YEAST, CLUE - Hepatitis Serology, [facility]  4. Depression, unspecified depression type Elevated PHQ9-17 Denies SI/HI. She feels her sx are effecting her but she is stable.  Given card for LCSW and for crisis services She is not on a medication currently. She does have a pysch MD at [facility] but was unable to see her due to lapse in [place] - Ambulatory referral  to [place]  5. Opiate use RN dispensed Naloxone kit Discussed her current use patterns which include using long acting morphine and short acting morphine that is provided by a friend. She is not using IV drugs. She reports her sister and her sisters partner are actively using heroin.   6. Bacterial vaginosis Treat wet mount per standing - metroNIDAZOLE (FLAGYL) 500 MG tablet; Take 1 tablet (500 mg total) by mouth 2 (two) times daily for 7 days.  Dispense: 14 tablet; Refill: 0  .  Return in about 4 weeks (around 10/13/2020) for IUD string check PRN if cannot feel strings.  No future appointments.  Caren Macadam, MD

## 2020-09-15 NOTE — Progress Notes (Signed)
Wet mount reviewed by provider, pt treated for BV per standing order. Naloxone kit reviewed, pt counseled, kit provided. Provider orders completed.

## 2020-09-16 ENCOUNTER — Encounter: Payer: Self-pay | Admitting: Family Medicine

## 2020-09-18 ENCOUNTER — Telehealth: Payer: Self-pay | Admitting: Family Medicine

## 2020-09-18 NOTE — Telephone Encounter (Signed)
Pt states that the antibiotics she was given Monday 4/11 an d started taking on Tuesday 4/12 are making her sick. She states that rather than calling something different in for her, she would like for a nurse or provider to call her.

## 2020-09-18 NOTE — Telephone Encounter (Signed)
Concerns about my meds that was given

## 2020-09-22 LAB — HEPATITIS B SURFACE ANTIGEN

## 2020-11-06 ENCOUNTER — Telehealth: Payer: Self-pay | Admitting: Family Medicine

## 2020-11-24 DIAGNOSIS — R5383 Other fatigue: Secondary | ICD-10-CM | POA: Diagnosis not present

## 2020-11-24 DIAGNOSIS — M549 Dorsalgia, unspecified: Secondary | ICD-10-CM | POA: Diagnosis not present

## 2021-10-02 DIAGNOSIS — G8929 Other chronic pain: Secondary | ICD-10-CM | POA: Diagnosis not present

## 2021-10-02 DIAGNOSIS — F331 Major depressive disorder, recurrent, moderate: Secondary | ICD-10-CM | POA: Diagnosis not present

## 2021-10-02 DIAGNOSIS — N939 Abnormal uterine and vaginal bleeding, unspecified: Secondary | ICD-10-CM | POA: Diagnosis not present

## 2021-10-02 DIAGNOSIS — T148XXA Other injury of unspecified body region, initial encounter: Secondary | ICD-10-CM | POA: Diagnosis not present

## 2021-10-02 DIAGNOSIS — L0291 Cutaneous abscess, unspecified: Secondary | ICD-10-CM | POA: Diagnosis not present

## 2021-10-02 DIAGNOSIS — F419 Anxiety disorder, unspecified: Secondary | ICD-10-CM | POA: Diagnosis not present

## 2021-10-02 DIAGNOSIS — M25511 Pain in right shoulder: Secondary | ICD-10-CM | POA: Diagnosis not present

## 2021-10-02 DIAGNOSIS — M542 Cervicalgia: Secondary | ICD-10-CM | POA: Diagnosis not present

## 2021-10-02 DIAGNOSIS — M5416 Radiculopathy, lumbar region: Secondary | ICD-10-CM | POA: Diagnosis not present

## 2024-03-19 ENCOUNTER — Ambulatory Visit: Payer: Self-pay
